# Patient Record
Sex: Female | Born: 1985 | Race: Black or African American | Hispanic: No | Marital: Single | Smoking: Never smoker
Health system: Southern US, Community
[De-identification: ages and names within clinical notes are randomized; demographics above are authoritative.]

## PROBLEM LIST (undated history)

## (undated) DIAGNOSIS — D219 Benign neoplasm of connective and other soft tissue, unspecified: Secondary | ICD-10-CM

## (undated) DIAGNOSIS — M779 Enthesopathy, unspecified: Secondary | ICD-10-CM

## (undated) DIAGNOSIS — A749 Chlamydial infection, unspecified: Secondary | ICD-10-CM

## (undated) DIAGNOSIS — I10 Essential (primary) hypertension: Secondary | ICD-10-CM

## (undated) DIAGNOSIS — Z872 Personal history of diseases of the skin and subcutaneous tissue: Secondary | ICD-10-CM

## (undated) DIAGNOSIS — B009 Herpesviral infection, unspecified: Secondary | ICD-10-CM

## (undated) DIAGNOSIS — R000 Tachycardia, unspecified: Secondary | ICD-10-CM

## (undated) DIAGNOSIS — E6609 Other obesity due to excess calories: Secondary | ICD-10-CM

## (undated) DIAGNOSIS — N946 Dysmenorrhea, unspecified: Secondary | ICD-10-CM

## (undated) DIAGNOSIS — M6289 Other specified disorders of muscle: Secondary | ICD-10-CM

## (undated) DIAGNOSIS — E669 Obesity, unspecified: Secondary | ICD-10-CM

## (undated) DIAGNOSIS — D259 Leiomyoma of uterus, unspecified: Secondary | ICD-10-CM

## (undated) DIAGNOSIS — E785 Hyperlipidemia, unspecified: Secondary | ICD-10-CM

## (undated) DIAGNOSIS — E119 Type 2 diabetes mellitus without complications: Secondary | ICD-10-CM

## (undated) DIAGNOSIS — R0602 Shortness of breath: Secondary | ICD-10-CM

## (undated) DIAGNOSIS — N9489 Other specified conditions associated with female genital organs and menstrual cycle: Secondary | ICD-10-CM

## (undated) DIAGNOSIS — J90 Pleural effusion, not elsewhere classified: Secondary | ICD-10-CM

## (undated) DIAGNOSIS — A6004 Herpesviral vulvovaginitis: Secondary | ICD-10-CM

## (undated) DIAGNOSIS — R002 Palpitations: Secondary | ICD-10-CM

## (undated) DIAGNOSIS — L83 Acanthosis nigricans: Secondary | ICD-10-CM

## (undated) HISTORY — DX: Pleural effusion, not elsewhere classified: J90

## (undated) HISTORY — DX: Essential (primary) hypertension: I10

## (undated) HISTORY — DX: Dysmenorrhea, unspecified: N94.6

## (undated) HISTORY — DX: Herpesviral vulvovaginitis: A60.04

## (undated) HISTORY — DX: Enthesopathy, unspecified: M77.9

## (undated) HISTORY — DX: Chlamydial infection, unspecified: A74.9

## (undated) HISTORY — DX: Other specified conditions associated with female genital organs and menstrual cycle: N94.89

## (undated) HISTORY — DX: Palpitations: R00.2

## (undated) HISTORY — DX: Other obesity due to excess calories: E66.09

## (undated) HISTORY — DX: Hyperlipidemia, unspecified: E78.5

## (undated) HISTORY — DX: Leiomyoma of uterus, unspecified: D25.9

## (undated) HISTORY — DX: Obesity, unspecified: E66.9

## (undated) HISTORY — DX: Tachycardia, unspecified: R00.0

## (undated) HISTORY — DX: Type 2 diabetes mellitus without complications: E11.9

## (undated) HISTORY — DX: Acanthosis nigricans: L83

## (undated) HISTORY — DX: Herpesviral infection, unspecified: B00.9

## (undated) HISTORY — DX: Other specified disorders of muscle: M62.89

## (undated) HISTORY — PX: NO PAST SURGERIES: SHX2092

## (undated) HISTORY — DX: Benign neoplasm of connective and other soft tissue, unspecified: D21.9

---
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
## 2005-08-13 ENCOUNTER — Emergency Department (HOSPITAL_COMMUNITY): Admission: EM | Admit: 2005-08-13 | Discharge: 2005-08-13 | Payer: Self-pay | Admitting: Emergency Medicine

## 2006-03-31 ENCOUNTER — Other Ambulatory Visit: Admission: RE | Admit: 2006-03-31 | Discharge: 2006-03-31 | Payer: Self-pay | Admitting: Family Medicine

## 2006-05-26 DIAGNOSIS — E7800 Pure hypercholesterolemia, unspecified: Secondary | ICD-10-CM | POA: Insufficient documentation

## 2006-05-27 ENCOUNTER — Encounter (INDEPENDENT_AMBULATORY_CARE_PROVIDER_SITE_OTHER): Payer: Self-pay | Admitting: *Deleted

## 2008-09-04 ENCOUNTER — Other Ambulatory Visit: Admission: RE | Admit: 2008-09-04 | Discharge: 2008-09-04 | Payer: Self-pay | Admitting: Family Medicine

## 2009-10-06 ENCOUNTER — Emergency Department (HOSPITAL_COMMUNITY): Admission: EM | Admit: 2009-10-06 | Discharge: 2009-10-06 | Payer: Self-pay | Admitting: Emergency Medicine

## 2010-11-18 ENCOUNTER — Other Ambulatory Visit: Payer: Self-pay | Admitting: Obstetrics and Gynecology

## 2010-11-18 ENCOUNTER — Other Ambulatory Visit (HOSPITAL_COMMUNITY)
Admission: RE | Admit: 2010-11-18 | Discharge: 2010-11-18 | Disposition: A | Discharge status: Home / Self Care | Payer: Managed Care, Other (non HMO) | Source: Ambulatory Visit | Attending: Obstetrics and Gynecology | Admitting: Obstetrics and Gynecology

## 2010-11-18 DIAGNOSIS — N760 Acute vaginitis: Secondary | ICD-10-CM | POA: Insufficient documentation

## 2010-11-18 DIAGNOSIS — Z113 Encounter for screening for infections with a predominantly sexual mode of transmission: Secondary | ICD-10-CM | POA: Insufficient documentation

## 2010-11-18 DIAGNOSIS — Z01419 Encounter for gynecological examination (general) (routine) without abnormal findings: Secondary | ICD-10-CM | POA: Insufficient documentation

## 2012-06-19 ENCOUNTER — Other Ambulatory Visit: Payer: Self-pay | Admitting: Family Medicine

## 2012-06-19 ENCOUNTER — Other Ambulatory Visit (HOSPITAL_COMMUNITY)
Admission: RE | Admit: 2012-06-19 | Discharge: 2012-06-19 | Disposition: A | Discharge status: Home / Self Care | Payer: 59 | Source: Ambulatory Visit | Attending: Family Medicine | Admitting: Family Medicine

## 2012-06-19 DIAGNOSIS — Z0000 Encounter for general adult medical examination without abnormal findings: Secondary | ICD-10-CM | POA: Insufficient documentation

## 2013-05-09 ENCOUNTER — Other Ambulatory Visit: Payer: Self-pay | Admitting: Obstetrics and Gynecology

## 2013-05-09 ENCOUNTER — Other Ambulatory Visit (HOSPITAL_COMMUNITY)
Admission: RE | Admit: 2013-05-09 | Discharge: 2013-05-09 | Disposition: A | Discharge status: Home / Self Care | Payer: 59 | Source: Ambulatory Visit | Attending: Obstetrics and Gynecology | Admitting: Obstetrics and Gynecology

## 2013-05-09 DIAGNOSIS — Z1151 Encounter for screening for human papillomavirus (HPV): Secondary | ICD-10-CM | POA: Insufficient documentation

## 2013-05-09 DIAGNOSIS — Z01419 Encounter for gynecological examination (general) (routine) without abnormal findings: Secondary | ICD-10-CM | POA: Insufficient documentation

## 2013-05-14 ENCOUNTER — Other Ambulatory Visit: Payer: Self-pay | Admitting: Nurse Practitioner

## 2013-05-14 ENCOUNTER — Other Ambulatory Visit: Payer: Self-pay | Admitting: Physician Assistant

## 2013-05-14 DIAGNOSIS — N6019 Diffuse cystic mastopathy of unspecified breast: Secondary | ICD-10-CM

## 2013-05-18 ENCOUNTER — Ambulatory Visit
Admission: RE | Admit: 2013-05-18 | Discharge: 2013-05-18 | Disposition: A | Discharge status: Home / Self Care | Payer: 59 | Source: Ambulatory Visit | Attending: Nurse Practitioner | Admitting: Nurse Practitioner

## 2013-05-18 DIAGNOSIS — N6019 Diffuse cystic mastopathy of unspecified breast: Secondary | ICD-10-CM

## 2013-10-12 ENCOUNTER — Emergency Department (HOSPITAL_COMMUNITY)
Admission: EM | Admit: 2013-10-12 | Discharge: 2013-10-12 | Disposition: A | Discharge status: Home / Self Care | Payer: 59 | Source: Home / Self Care | Attending: Family Medicine | Admitting: Family Medicine

## 2013-10-12 ENCOUNTER — Encounter (HOSPITAL_COMMUNITY): Payer: Self-pay | Admitting: Emergency Medicine

## 2013-10-12 DIAGNOSIS — R42 Dizziness and giddiness: Secondary | ICD-10-CM

## 2013-10-12 DIAGNOSIS — G44209 Tension-type headache, unspecified, not intractable: Secondary | ICD-10-CM

## 2013-10-12 LAB — POCT I-STAT, CHEM 8
BUN: 8 mg/dL (ref 6–23)
Calcium, Ion: 1.18 mmol/L (ref 1.12–1.23)
Chloride: 103 meq/L (ref 96–112)
Creatinine, Ser: 1 mg/dL (ref 0.50–1.10)
Glucose, Bld: 91 mg/dL (ref 70–99)
HCT: 46 % (ref 36.0–46.0)
Hemoglobin: 15.6 g/dL — ABNORMAL HIGH (ref 12.0–15.0)
Potassium: 4.5 meq/L (ref 3.7–5.3)
Sodium: 138 meq/L (ref 137–147)
TCO2: 24 mmol/L (ref 0–100)

## 2013-10-12 LAB — POCT URINALYSIS DIP (DEVICE)
BILIRUBIN URINE: NEGATIVE
GLUCOSE, UA: NEGATIVE mg/dL
KETONES UR: NEGATIVE mg/dL
Leukocytes, UA: NEGATIVE
NITRITE: NEGATIVE
PH: 7 (ref 5.0–8.0)
Protein, ur: NEGATIVE mg/dL
Specific Gravity, Urine: 1.02 (ref 1.005–1.030)
Urobilinogen, UA: 0.2 mg/dL (ref 0.0–1.0)

## 2013-10-12 LAB — POCT PREGNANCY, URINE: PREG TEST UR: NEGATIVE

## 2013-10-12 MED ORDER — MECLIZINE HCL 25 MG PO TABS
25.0000 mg | ORAL_TABLET | Freq: Four times a day (QID) | ORAL | Status: DC
Start: 2013-10-12 — End: 2013-12-10

## 2013-10-12 NOTE — ED Provider Notes (Signed)
CSN: [id number]     Arrival date & time 10/12/13  6283 History   First MD Initiated Contact with Patient 10/12/13 [phone]     Chief Complaint  Patient presents with  . Headache   (Consider location/radiation/quality/duration/timing/severity/associated sxs/prior Treatment) HPI Comments: 28 year old female presents complaining of dizziness. Yesterday she started to have dizziness that is worse with going from a seated to a standing position. She describes this as feeling like the room is spinning around her. If she sits still it goes away somewhat and she starts to feel better. She has had nausea but no actual vomiting. She has an associated headache that is described as a mild headache behind her eyes and in the left side of her forehead, she is not having headache right now but has had occasionally for the past few years. She also admits to having a slightly heavier period than usual that started 2 days ago. She also has a history of uterine fibroids. No significant history of dizziness or vertigo. All other review of systems negative  Patient is a 28 y.o. female presenting with headaches.  Headache Associated symptoms: dizziness and nausea   Associated symptoms: no abdominal pain, no cough, no fever, no myalgias and no vomiting     History reviewed. No pertinent past medical history. History reviewed. No pertinent past surgical history. History reviewed. No pertinent family history. History  Substance Use Topics  . Smoking status: Not on file  . Smokeless tobacco: Not on file  . Alcohol Use: No   OB History   Grav Para Term Preterm Abortions TAB SAB Ect Mult Living                 Review of Systems  Constitutional: Negative for fever and chills.  Eyes: Negative for visual disturbance.  Respiratory: Negative for cough and shortness of breath.   Cardiovascular: Negative for chest pain, palpitations and leg swelling.  Gastrointestinal: Positive for nausea. Negative for vomiting and  abdominal pain.  Endocrine: Negative for polydipsia and polyuria.  Genitourinary: Positive for vaginal bleeding. Negative for dysuria, urgency and frequency.  Musculoskeletal: Negative for arthralgias and myalgias.  Skin: Negative for rash.  Neurological: Positive for dizziness and headaches. Negative for weakness and light-headedness.  All other systems reviewed and are negative.   Allergies  Review of patient's allergies indicates no known allergies.  Home Medications   Prior to Admission medications   Medication Sig Start Date End Date Taking? Authorizing Provider  meclizine (ANTIVERT) 25 MG tablet Take 1 tablet (25 mg total) by mouth 4 (four) times daily. 10/12/13   Freeman Caldron Kaari Zeigler, PA-C   BP 127/79  Pulse 81  Temp(Src) 98.8 F (37.1 C) (Oral)  Resp 16  SpO2 100% Physical Exam  Nursing note and vitals reviewed. Constitutional: She is oriented to person, place, and time. Vital signs are normal. She appears well-developed and well-nourished. No distress.  HENT:  Head: Normocephalic and atraumatic.  Right Ear: Tympanic membrane, external ear and ear canal normal.  Left Ear: Tympanic membrane, external ear and ear canal normal.  Cardiovascular: Normal rate, regular rhythm and normal heart sounds.  Exam reveals no gallop and no friction rub.   No murmur heard. Pulmonary/Chest: Effort normal and breath sounds normal. No respiratory distress. She has no wheezes. She has no rales.  Neurological: She is alert and oriented to person, place, and time. She has normal strength and normal reflexes. No cranial nerve deficit or sensory deficit. She exhibits normal muscle tone. She displays  a negative Romberg sign. Coordination and gait normal.  Skin: Skin is warm and dry. No rash noted. She is not diaphoretic.  Psychiatric: She has a normal mood and affect. Judgment normal.    ED Course  ED EKG  Date/Time: 10/12/2013 10:01 AM Performed by: Allena Katz, H Authorized by: Allena Katz, H Comparison: not compared with previous ECG  Rhythm: sinus rhythm Rate: normal QRS axis: normal Conduction: conduction normal ST Segments: ST segments normal T Waves: T waves normal Other: no other findings Clinical impression: normal ECG   (including critical care time) Labs Review Labs Reviewed  POCT URINALYSIS DIP (DEVICE) - Abnormal; Notable for the following:    Hgb urine dipstick TRACE (*)    All other components within normal limits  POCT I-STAT, CHEM 8 - Abnormal; Notable for the following:    Hemoglobin 15.6 (*)    All other components within normal limits  POCT PREGNANCY, URINE    Imaging Review No results found.   MDM   1. Vertigo   2. Tension headache    most consistent with positional vertigo. Treat with meclizine. Advised Excedrin Migraine for the headaches. Followup PRN if no improvement in a few days    Meds ordered this encounter  Medications  . meclizine (ANTIVERT) 25 MG tablet    Sig: Take 1 tablet (25 mg total) by mouth 4 (four) times daily.    Dispense:  28 tablet    Refill:  0    Order Specific Question:  Supervising Provider    Answer:  Lynne Leader, [place]       Liam Graham, PA-C 10/12/13 1007

## 2013-10-12 NOTE — Discharge Instructions (Signed)
Vertigo Vertigo means you feel like you or your surroundings are moving when they are not. Vertigo can be dangerous if it occurs when you are at work, driving, or performing difficult activities.  CAUSES  Vertigo occurs when there is a conflict of signals sent to your brain from the visual and sensory systems in your body. There are many different causes of vertigo, including:  Infections, especially in the inner ear.  A bad reaction to a drug or misuse of alcohol and medicines.  Withdrawal from drugs or alcohol.  Rapidly changing positions, such as lying down or rolling over in bed.  A migraine headache.  Decreased blood flow to the brain.  Increased pressure in the brain from a head injury, infection, tumor, or bleeding. SYMPTOMS  You may feel as though the world is spinning around or you are falling to the ground. Because your balance is upset, vertigo can cause nausea and vomiting. You may have involuntary eye movements (nystagmus). DIAGNOSIS  Vertigo is usually diagnosed by physical exam. If the cause of your vertigo is unknown, your caregiver may perform imaging tests, such as an MRI scan (magnetic resonance imaging). TREATMENT  Most cases of vertigo resolve on their own, without treatment. Depending on the cause, your caregiver may prescribe certain medicines. If your vertigo is related to body position issues, your caregiver may recommend movements or procedures to correct the problem. In rare cases, if your vertigo is caused by certain inner ear problems, you may need surgery. HOME CARE INSTRUCTIONS   Follow your caregiver's instructions.  Avoid driving.  Avoid operating heavy machinery.  Avoid performing any tasks that would be dangerous to you or others during a vertigo episode.  Tell your caregiver if you notice that certain medicines seem to be causing your vertigo. Some of the medicines used to treat vertigo episodes can actually make them worse in some people. SEEK  IMMEDIATE MEDICAL CARE IF:   Your medicines do not relieve your vertigo or are making it worse.  You develop problems with talking, walking, weakness, or using your arms, hands, or legs.  You develop severe headaches.  Your nausea or vomiting continues or gets worse.  You develop visual changes.  A family member notices behavioral changes.  Your condition gets worse. MAKE SURE YOU:  Understand these instructions.  Will watch your condition.  Will get help right away if you are not doing well or get worse. Document Released: 12/23/2004 Document Revised: 06/07/2011 Document Reviewed: 10/01/2010 [facility] Patient Information 2015 [place], [place]. This information is not intended to replace advice given to you by your health care provider. Make sure you discuss any questions you have with your health care provider.   Dizziness Dizziness is a common problem. It is a feeling of unsteadiness or light-headedness. You may feel like you are about to faint. Dizziness can lead to injury if you stumble or fall. A person of any age group can suffer from dizziness, but dizziness is more common in older adults. CAUSES  Dizziness can be caused by many different things, including:  Middle ear problems.  Standing for too long.  Infections.  An allergic reaction.  Aging.  An emotional response to something, such as the sight of blood.  Side effects of medicines.  Tiredness.  Problems with circulation or blood pressure.  Excessive use of alcohol or medicines, or illegal drug use.  Breathing too fast (hyperventilation).  An irregular heart rhythm (arrhythmia).  A low red blood cell count (anemia).  Pregnancy.  Vomiting, diarrhea, fever, or other illnesses that cause body fluid loss (dehydration).  Diseases or conditions such as Parkinson's disease, high blood pressure (hypertension), diabetes, and thyroid problems.  Exposure to extreme heat. DIAGNOSIS  Your health care  provider will ask about your symptoms, perform a physical exam, and perform an electrocardiogram (ECG) to record the electrical activity of your heart. Your health care provider may also perform other heart or blood tests to determine the cause of your dizziness. These may include:  Transthoracic echocardiogram (TTE). During echocardiography, sound waves are used to evaluate how blood flows through your heart.  Transesophageal echocardiogram (TEE).  Cardiac monitoring. This allows your health care provider to monitor your heart rate and rhythm in real time.  Holter monitor. This is a portable device that records your heartbeat and can help diagnose heart arrhythmias. It allows your health care provider to track your heart activity for several days if needed.  Stress tests by exercise or by giving medicine that makes the heart beat faster. TREATMENT  Treatment of dizziness depends on the cause of your symptoms and can vary greatly. HOME CARE INSTRUCTIONS   Drink enough fluids to keep your urine clear or pale yellow. This is especially important in very hot weather. In older adults, it is also important in cold weather.  Take your medicine exactly as directed if your dizziness is caused by medicines. When taking blood pressure medicines, it is especially important to get up slowly.  Rise slowly from chairs and steady yourself until you feel okay.  In the morning, first sit up on the side of the bed. When you feel okay, stand slowly while holding onto something until you know your balance is fine.  Move your legs often if you need to stand in one place for a long time. Tighten and relax your muscles in your legs while standing.  Have someone stay with you for 1-2 days if dizziness continues to be a problem. Do this until you feel you are well enough to stay alone. Have the person call your health care provider if he or she notices changes in you that are concerning.  Do not drive or use heavy  machinery if you feel dizzy.  Do not drink alcohol. SEEK IMMEDIATE MEDICAL CARE IF:   Your dizziness or light-headedness gets worse.  You feel nauseous or vomit.  You have problems talking, walking, or using your arms, hands, or legs.  You feel weak.  You are not thinking clearly or you have trouble forming sentences. It may take a friend or family member to notice this.  You have chest pain, abdominal pain, shortness of breath, or sweating.  Your vision changes.  You notice any bleeding.  You have side effects from medicine that seems to be getting worse rather than better. MAKE SURE YOU:   Understand these instructions.  Will watch your condition.  Will get help right away if you are not doing well or get worse. Document Released: 09/08/2000 Document Revised: 03/20/2013 Document Reviewed: 10/02/2010 [facility] Patient Information 2015 [place], [place]. This information is not intended to replace advice given to you by your health care provider. Make sure you discuss any questions you have with your health care provider.  Epley Maneuver Self-Care WHAT IS THE EPLEY MANEUVER? The Epley maneuver is an exercise you can do to relieve symptoms of benign paroxysmal positional vertigo (BPPV). This condition is often just referred to as vertigo. BPPV is caused by the movement of tiny crystals (canaliths) inside  your inner ear. The accumulation and movement of canaliths in your inner ear causes a sudden spinning sensation (vertigo) when you move your head to certain positions. Vertigo usually lasts about 30 seconds. BPPV usually occurs in just one ear. If you get vertigo when you lie on your left side, you probably have BPPV in your left ear. Your health care provider can tell you which ear is involved.  BPPV may be caused by a head injury. Many people older than 50 get BPPV for unknown reasons. If you have been diagnosed with BPPV, your health care provider may teach you how to do this  maneuver. BPPV is not life threatening (benign) and usually goes away in time.  WHEN SHOULD I PERFORM THE EPLEY MANEUVER? You can do this maneuver at home whenever you have symptoms of vertigo. You may do the Epley maneuver up to 3 times a day until your symptoms of vertigo go away. HOW SHOULD I DO THE EPLEY MANEUVER? 1. Sit on the edge of a bed or table with your back straight. Your legs should be extended or hanging over the edge of the bed or table.  2. Turn your head halfway toward the affected ear.  3. Lie backward quickly with your head turned until you are lying flat on your back. You may want to position a pillow under your shoulders.  4. Hold this position for 30 seconds. You may experience an attack of vertigo. This is normal. Hold this position until the vertigo stops. 5. Then turn your head to the opposite direction until your unaffected ear is facing the floor.  6. Hold this position for 30 seconds. You may experience an attack of vertigo. This is normal. Hold this position until the vertigo stops. 7. Now turn your whole body to the same side as your head. Hold for another 30 seconds.  8. You can then sit back up. ARE THERE RISKS TO THIS MANEUVER? In some cases, you may have other symptoms (such as changes in your vision, weakness, or numbness). If you have these symptoms, stop doing the maneuver and call your health care provider. Even if doing these maneuvers relieves your vertigo, you may still have dizziness. Dizziness is the sensation of light-headedness but without the sensation of movement. Even though the Epley maneuver may relieve your vertigo, it is possible that your symptoms will return within 5 years. WHAT SHOULD I DO AFTER THIS MANEUVER? After doing the Epley maneuver, you can return to your normal activities. Ask your doctor if there is anything you should do at home to prevent vertigo. This may include:  Sleeping with two or more pillows to keep your head  elevated.  Not sleeping on the side of your affected ear.  Getting up slowly from bed.  Avoiding sudden movements during the day.  Avoiding extreme head movement, like looking up or bending over.  Wearing a cervical collar to prevent sudden head movements. WHAT SHOULD I DO IF MY SYMPTOMS GET WORSE? Call your health care provider if your vertigo gets worse. Call your provider right way if you have other symptoms, including:   Nausea.  Vomiting.  Headache.  Weakness.  Numbness.  Vision changes. Document Released: 03/20/2013 Document Reviewed: 02/06/2013 [facility] Patient Information 2015 [place], [place]. This information is not intended to replace advice given to you by your health care provider. Make sure you discuss any questions you have with your health care provider.  Tension Headache A tension headache is a feeling of pain,  pressure, or aching often felt over the front and sides of the head. The pain can be dull or can feel tight (constricting). It is the most common type of headache. Tension headaches are not normally associated with nausea or vomiting and do not get worse with physical activity. Tension headaches can last 30 minutes to several days.  CAUSES  The exact cause is not known, but it may be caused by chemicals and hormones in the brain that lead to pain. Tension headaches often begin after stress, anxiety, or depression. Other triggers may include:  Alcohol.  Caffeine (too much or withdrawal).  Respiratory infections (colds, flu, sinus infections).  Dental problems or teeth clenching.  Fatigue.  Holding your head and neck in one position too long while using a computer. SYMPTOMS   Pressure around the head.   Dull, aching head pain.   Pain felt over the front and sides of the head.   Tenderness in the muscles of the head, neck, and shoulders. DIAGNOSIS  A tension headache is often diagnosed based on:   Symptoms.   Physical examination.    A CT scan or MRI of your head. These tests may be ordered if symptoms are severe or unusual. TREATMENT  Medicines may be given to help relieve symptoms.  HOME CARE INSTRUCTIONS   Only take over-the-counter or prescription medicines for pain or discomfort as directed by your caregiver.   Lie down in a dark, quiet room when you have a headache.   Keep a journal to find out what may be triggering your headaches. For example, write down:  What you eat and drink.  How much sleep you get.  Any change to your diet or medicines.  Try massage or other relaxation techniques.   Ice packs or heat applied to the head and neck can be used. Use these 3 to 4 times per day for 15 to 20 minutes each time, or as needed.   Limit stress.   Sit up straight, and do not tense your muscles.   Quit smoking if you smoke.  Limit alcohol use.  Decrease the amount of caffeine you drink, or stop drinking caffeine.  Eat and exercise regularly.  Get 7 to 9 hours of sleep, or as recommended by your caregiver.  Avoid excessive use of pain medicine as recurrent headaches can occur.  SEEK MEDICAL CARE IF:   You have problems with the medicines you were prescribed.  Your medicines do not work.  You have a change from the usual headache.  You have nausea or vomiting. SEEK IMMEDIATE MEDICAL CARE IF:   Your headache becomes severe.  You have a fever.  You have a stiff neck.  You have loss of vision.  You have muscular weakness or loss of muscle control.  You lose your balance or have trouble walking.  You feel faint or pass out.  You have severe symptoms that are different from your first symptoms. MAKE SURE YOU:   Understand these instructions.  Will watch your condition.  Will get help right away if you are not doing well or get worse. Document Released: 03/15/2005 Document Revised: 06/07/2011 Document Reviewed: 03/05/2011 [facility] Patient Information 2015 [place], [place].  This information is not intended to replace advice given to you by your health care provider. Make sure you discuss any questions you have with your health care provider.

## 2013-10-12 NOTE — ED Notes (Signed)
Pt  Reports  Headache  /  Dizzy         And   Associated  Nausea     Recently   -  denys  Any injury    Awake  And  Alert  And  Oriented      Pt  Also  Reports  Vag  Bleeding           Heavy  For cycle       As  Well  As  menstural  Cramps

## 2013-10-14 NOTE — ED Provider Notes (Signed)
Medical screening examination/treatment/procedure(s) were performed by a resident physician or non-physician practitioner and as the supervising physician I was immediately available for consultation/collaboration.  Lynne Leader, MD    Gregor Hams, MD 10/14/13 [phone]

## 2013-12-10 ENCOUNTER — Encounter (HOSPITAL_COMMUNITY): Payer: Self-pay | Admitting: Pharmacist

## 2013-12-19 ENCOUNTER — Encounter (HOSPITAL_COMMUNITY): Payer: Self-pay

## 2013-12-19 ENCOUNTER — Encounter (HOSPITAL_COMMUNITY)
Admission: RE | Admit: 2013-12-19 | Discharge: 2013-12-19 | Disposition: A | Discharge status: Home / Self Care | Payer: 59 | Source: Ambulatory Visit | Attending: Obstetrics and Gynecology | Admitting: Obstetrics and Gynecology

## 2013-12-19 DIAGNOSIS — Z01812 Encounter for preprocedural laboratory examination: Secondary | ICD-10-CM | POA: Insufficient documentation

## 2013-12-19 DIAGNOSIS — D259 Leiomyoma of uterus, unspecified: Secondary | ICD-10-CM | POA: Insufficient documentation

## 2013-12-19 HISTORY — DX: Shortness of breath: R06.02

## 2013-12-19 LAB — CBC
HEMATOCRIT: 39.7 % (ref 36.0–46.0)
HEMOGLOBIN: 13.3 g/dL (ref 12.0–15.0)
MCH: 26.8 pg (ref 26.0–34.0)
MCHC: 33.5 g/dL (ref 30.0–36.0)
MCV: 80 fL (ref 78.0–100.0)
Platelets: 315 10*3/uL (ref 150–400)
RBC: 4.96 MIL/uL (ref 3.87–5.11)
RDW: 15.2 % (ref 11.5–15.5)
WBC: 9.9 10*3/uL (ref 4.0–10.5)

## 2013-12-19 LAB — ABO/RH: ABO/RH(D): O POS

## 2013-12-19 NOTE — Patient Instructions (Signed)
Your procedure is scheduled on: 12/24/13  Enter through the Main Entrance at : [place] up desk phone and dial [phone] and inform us of your arrival.  Please call [phone] if you have any problems the morning of surgery.  Remember: Do not eat food or drink liquids, including water, after midnight: [place]  You may brush your teeth the morning of surgery  DO NOT wear jewelry, eye make-up, lipstick,body lotion, or dark fingernail polish.  (Polished toes are ok) You may wear deodorant.  If you are to be admitted after surgery, leave suitcase in car until your room has been assigned. Patients discharged on the day of surgery will not be allowed to drive home. Wear loose fitting, comfortable clothes for your ride home.

## 2013-12-24 ENCOUNTER — Inpatient Hospital Stay (HOSPITAL_COMMUNITY): Payer: 59 | Admitting: Anesthesiology

## 2013-12-24 ENCOUNTER — Encounter (HOSPITAL_COMMUNITY)
Admission: RE | Disposition: A | Discharge status: Home / Self Care | Payer: Self-pay | Source: Ambulatory Visit | Attending: Obstetrics and Gynecology

## 2013-12-24 ENCOUNTER — Encounter (HOSPITAL_COMMUNITY): Payer: Self-pay

## 2013-12-24 ENCOUNTER — Encounter (HOSPITAL_COMMUNITY): Payer: 59 | Admitting: Anesthesiology

## 2013-12-24 ENCOUNTER — Inpatient Hospital Stay (HOSPITAL_COMMUNITY)
Admission: RE | Admit: 2013-12-24 | Discharge: 2013-12-26 | DRG: 742 | Disposition: A | Discharge status: Home / Self Care | Payer: 59 | Source: Ambulatory Visit | Attending: Obstetrics and Gynecology | Admitting: Obstetrics and Gynecology

## 2013-12-24 DIAGNOSIS — N925 Other specified irregular menstruation: Secondary | ICD-10-CM | POA: Diagnosis present

## 2013-12-24 DIAGNOSIS — Z823 Family history of stroke: Secondary | ICD-10-CM

## 2013-12-24 DIAGNOSIS — N949 Unspecified condition associated with female genital organs and menstrual cycle: Secondary | ICD-10-CM | POA: Diagnosis present

## 2013-12-24 DIAGNOSIS — D219 Benign neoplasm of connective and other soft tissue, unspecified: Secondary | ICD-10-CM | POA: Diagnosis present

## 2013-12-24 DIAGNOSIS — Z6841 Body Mass Index (BMI) 40.0 and over, adult: Secondary | ICD-10-CM

## 2013-12-24 DIAGNOSIS — D259 Leiomyoma of uterus, unspecified: Secondary | ICD-10-CM | POA: Diagnosis present

## 2013-12-24 DIAGNOSIS — Z833 Family history of diabetes mellitus: Secondary | ICD-10-CM

## 2013-12-24 DIAGNOSIS — N938 Other specified abnormal uterine and vaginal bleeding: Secondary | ICD-10-CM | POA: Diagnosis present

## 2013-12-24 DIAGNOSIS — D500 Iron deficiency anemia secondary to blood loss (chronic): Secondary | ICD-10-CM | POA: Diagnosis present

## 2013-12-24 DIAGNOSIS — Z9889 Other specified postprocedural states: Secondary | ICD-10-CM

## 2013-12-24 HISTORY — PX: MYOMECTOMY: SHX85

## 2013-12-24 LAB — CBC
HCT: 31 % — ABNORMAL LOW (ref 36.0–46.0)
Hemoglobin: 10.4 g/dL — ABNORMAL LOW (ref 12.0–15.0)
MCH: 26.9 pg (ref 26.0–34.0)
MCHC: 33.5 g/dL (ref 30.0–36.0)
MCV: 80.1 fL (ref 78.0–100.0)
PLATELETS: 234 10*3/uL (ref 150–400)
RBC: 3.87 MIL/uL (ref 3.87–5.11)
RDW: 15.1 % (ref 11.5–15.5)
WBC: 19.7 10*3/uL — ABNORMAL HIGH (ref 4.0–10.5)

## 2013-12-24 LAB — PREGNANCY, URINE: Preg Test, Ur: NEGATIVE

## 2013-12-24 LAB — PREPARE RBC (CROSSMATCH)

## 2013-12-24 SURGERY — MYOMECTOMY, ABDOMINAL APPROACH
Anesthesia: General | Site: Abdomen

## 2013-12-24 MED ORDER — ONDANSETRON HCL 4 MG/2ML IJ SOLN
INTRAMUSCULAR | Status: AC
  Filled 2013-12-24: qty 2

## 2013-12-24 MED ORDER — NEOSTIGMINE METHYLSULFATE 10 MG/10ML IV SOLN
INTRAVENOUS | Status: AC
  Filled 2013-12-24: qty 1

## 2013-12-24 MED ORDER — KETOROLAC TROMETHAMINE 30 MG/ML IJ SOLN
30.0000 mg | Freq: Four times a day (QID) | INTRAMUSCULAR | Status: DC | PRN
  Administered 2013-12-24 – 2013-12-25 (×2): 30 mg via INTRAVENOUS
  Filled 2013-12-24 (×2): qty 1

## 2013-12-24 MED ORDER — HYDROMORPHONE 0.3 MG/ML IV SOLN
INTRAVENOUS | Status: DC
  Administered 2013-12-24: 1.2 mg via INTRAVENOUS
  Administered 2013-12-24: 15:00:00 via INTRAVENOUS
  Administered 2013-12-24: 2 mg via INTRAVENOUS
  Administered 2013-12-25: 0.6 mg via INTRAVENOUS
  Administered 2013-12-25: 0.3 mg via INTRAVENOUS
  Filled 2013-12-24: qty 25

## 2013-12-24 MED ORDER — ONDANSETRON HCL 4 MG/2ML IJ SOLN
INTRAMUSCULAR | Status: DC | PRN
  Administered 2013-12-24: 4 mg via INTRAVENOUS

## 2013-12-24 MED ORDER — GLYCOPYRROLATE 0.2 MG/ML IJ SOLN
INTRAMUSCULAR | Status: AC
Start: 2013-12-24 — End: 2013-12-24
  Filled 2013-12-24: qty 3

## 2013-12-24 MED ORDER — MIDAZOLAM HCL 2 MG/2ML IJ SOLN
INTRAMUSCULAR | Status: DC | PRN
  Administered 2013-12-24: 2 mg via INTRAVENOUS

## 2013-12-24 MED ORDER — MEPERIDINE HCL 25 MG/ML IJ SOLN: 6.2500 mg | INTRAMUSCULAR | Status: DC | PRN

## 2013-12-24 MED ORDER — IBUPROFEN 600 MG PO TABS
600.0000 mg | ORAL_TABLET | Freq: Four times a day (QID) | ORAL | Status: DC | PRN
  Administered 2013-12-25 – 2013-12-26 (×4): 600 mg via ORAL
  Filled 2013-12-24 (×4): qty 1

## 2013-12-24 MED ORDER — PROPOFOL 10 MG/ML IV EMUL
INTRAVENOUS | Status: AC
  Filled 2013-12-24: qty 20

## 2013-12-24 MED ORDER — DEXAMETHASONE SODIUM PHOSPHATE 4 MG/ML IJ SOLN
INTRAMUSCULAR | Status: DC | PRN
  Administered 2013-12-24: 4 mg via INTRAVENOUS

## 2013-12-24 MED ORDER — KETOROLAC TROMETHAMINE 30 MG/ML IJ SOLN
INTRAMUSCULAR | Status: DC | PRN
  Administered 2013-12-24: 30 mg via INTRAVENOUS

## 2013-12-24 MED ORDER — PROPOFOL 10 MG/ML IV BOLUS
INTRAVENOUS | Status: DC | PRN
  Administered 2013-12-24: 200 mg via INTRAVENOUS

## 2013-12-24 MED ORDER — DIPHENHYDRAMINE HCL 50 MG/ML IJ SOLN
12.5000 mg | Freq: Four times a day (QID) | INTRAMUSCULAR | Status: DC | PRN
Start: 2013-12-24 — End: 2013-12-25

## 2013-12-24 MED ORDER — PROPOFOL 10 MG/ML IV EMUL
INTRAVENOUS | Status: AC
  Filled 2013-12-24: qty 40

## 2013-12-24 MED ORDER — DEXAMETHASONE SODIUM PHOSPHATE 10 MG/ML IJ SOLN
INTRAMUSCULAR | Status: AC
  Filled 2013-12-24: qty 1

## 2013-12-24 MED ORDER — GLYCOPYRROLATE 0.2 MG/ML IJ SOLN
INTRAMUSCULAR | Status: DC | PRN
  Administered 2013-12-24: 0.4 mg via INTRAVENOUS

## 2013-12-24 MED ORDER — ACETAMINOPHEN 160 MG/5ML PO SOLN
975.0000 mg | Freq: Once | ORAL | Status: AC
  Administered 2013-12-24: 975 mg via ORAL

## 2013-12-24 MED ORDER — SCOPOLAMINE 1 MG/3DAYS TD PT72
1.0000 | MEDICATED_PATCH | Freq: Once | TRANSDERMAL | Status: DC
  Administered 2013-12-24: 1.5 mg via TRANSDERMAL

## 2013-12-24 MED ORDER — CEFAZOLIN SODIUM-DEXTROSE 2-3 GM-% IV SOLR
2.0000 g | INTRAVENOUS | Status: AC
  Administered 2013-12-24: 2 g via INTRAVENOUS

## 2013-12-24 MED ORDER — NALOXONE HCL 0.4 MG/ML IJ SOLN
0.4000 mg | INTRAMUSCULAR | Status: DC | PRN
Start: 2013-12-24 — End: 2013-12-25

## 2013-12-24 MED ORDER — KETOROLAC TROMETHAMINE 30 MG/ML IJ SOLN
INTRAMUSCULAR | Status: AC
  Filled 2013-12-24: qty 1

## 2013-12-24 MED ORDER — INFLUENZA VAC SPLIT QUAD 0.5 ML IM SUSY
0.5000 mL | PREFILLED_SYRINGE | INTRAMUSCULAR | Status: AC
  Administered 2013-12-25: 0.5 mL via INTRAMUSCULAR

## 2013-12-24 MED ORDER — MENTHOL 3 MG MT LOZG
1.0000 | LOZENGE | OROMUCOSAL | Status: DC | PRN
  Administered 2013-12-25: 3 mg via ORAL
  Filled 2013-12-24: qty 9

## 2013-12-24 MED ORDER — LACTATED RINGERS IV SOLN
INTRAVENOUS | Status: DC
  Administered 2013-12-24 (×8): via INTRAVENOUS

## 2013-12-24 MED ORDER — HYDROMORPHONE HCL 1 MG/ML IJ SOLN
0.2500 mg | INTRAMUSCULAR | Status: DC | PRN
  Administered 2013-12-24: 0.5 mg via INTRAVENOUS

## 2013-12-24 MED ORDER — GLYCOPYRROLATE 0.2 MG/ML IJ SOLN
INTRAMUSCULAR | Status: AC
  Filled 2013-12-24: qty 1

## 2013-12-24 MED ORDER — DOCUSATE SODIUM 100 MG PO CAPS
100.0000 mg | ORAL_CAPSULE | Freq: Two times a day (BID) | ORAL | Status: DC
  Administered 2013-12-25 – 2013-12-26 (×4): 100 mg via ORAL
  Filled 2013-12-24 (×4): qty 1

## 2013-12-24 MED ORDER — LACTATED RINGERS IV SOLN
INTRAVENOUS | Status: DC
  Administered 2013-12-24: 22:00:00 via INTRAVENOUS

## 2013-12-24 MED ORDER — LIDOCAINE HCL (CARDIAC) 20 MG/ML IV SOLN
INTRAVENOUS | Status: AC
  Filled 2013-12-24: qty 5

## 2013-12-24 MED ORDER — FENTANYL CITRATE 0.05 MG/ML IJ SOLN
INTRAMUSCULAR | Status: DC | PRN
  Administered 2013-12-24: 50 ug via INTRAVENOUS
  Administered 2013-12-24: 100 ug via INTRAVENOUS
  Administered 2013-12-24 (×3): 50 ug via INTRAVENOUS
  Administered 2013-12-24 (×2): 100 ug via INTRAVENOUS

## 2013-12-24 MED ORDER — CEFAZOLIN SODIUM-DEXTROSE 2-3 GM-% IV SOLR
INTRAVENOUS | Status: AC
  Filled 2013-12-24: qty 50

## 2013-12-24 MED ORDER — HYDROMORPHONE HCL 1 MG/ML IJ SOLN
INTRAMUSCULAR | Status: DC | PRN
  Administered 2013-12-24: 1 mg via INTRAVENOUS

## 2013-12-24 MED ORDER — FENTANYL CITRATE 0.05 MG/ML IJ SOLN
INTRAMUSCULAR | Status: AC
  Filled 2013-12-24: qty 5

## 2013-12-24 MED ORDER — HYDROMORPHONE HCL 1 MG/ML IJ SOLN
INTRAMUSCULAR | Status: AC
  Filled 2013-12-24: qty 1

## 2013-12-24 MED ORDER — NEOSTIGMINE METHYLSULFATE 10 MG/10ML IV SOLN
INTRAVENOUS | Status: DC | PRN
  Administered 2013-12-24: 2 mg via INTRAVENOUS

## 2013-12-24 MED ORDER — PROMETHAZINE HCL 25 MG/ML IJ SOLN: 6.2500 mg | INTRAMUSCULAR | Status: DC | PRN

## 2013-12-24 MED ORDER — KETOROLAC TROMETHAMINE 30 MG/ML IJ SOLN: 15.0000 mg | Freq: Once | INTRAMUSCULAR | Status: DC | PRN

## 2013-12-24 MED ORDER — SCOPOLAMINE 1 MG/3DAYS TD PT72
MEDICATED_PATCH | TRANSDERMAL | Status: AC
  Administered 2013-12-24: 1.5 mg via TRANSDERMAL
  Filled 2013-12-24: qty 1

## 2013-12-24 MED ORDER — SODIUM CHLORIDE 0.9 % IJ SOLN: 9.0000 mL | INTRAMUSCULAR | Status: DC | PRN

## 2013-12-24 MED ORDER — 0.9 % SODIUM CHLORIDE (POUR BTL) OPTIME
TOPICAL | Status: DC | PRN
  Administered 2013-12-24: 1000 mL

## 2013-12-24 MED ORDER — ROCURONIUM BROMIDE 100 MG/10ML IV SOLN
INTRAVENOUS | Status: DC | PRN
  Administered 2013-12-24: 50 mg via INTRAVENOUS
  Administered 2013-12-24 (×2): 5 mg via INTRAVENOUS

## 2013-12-24 MED ORDER — DEXAMETHASONE SODIUM PHOSPHATE 4 MG/ML IJ SOLN
INTRAMUSCULAR | Status: AC
  Filled 2013-12-24: qty 1

## 2013-12-24 MED ORDER — ONDANSETRON HCL 4 MG/2ML IJ SOLN
4.0000 mg | Freq: Four times a day (QID) | INTRAMUSCULAR | Status: DC | PRN
Start: 2013-12-24 — End: 2013-12-26

## 2013-12-24 MED ORDER — VASOPRESSIN 20 UNIT/ML IJ SOLN
INTRAMUSCULAR | Status: DC | PRN
  Administered 2013-12-24 (×2): via INTRAMUSCULAR

## 2013-12-24 MED ORDER — ALUM & MAG HYDROXIDE-SIMETH 200-200-20 MG/5ML PO SUSP: 30.0000 mL | ORAL | Status: DC | PRN

## 2013-12-24 MED ORDER — GLYCOPYRROLATE 0.2 MG/ML IJ SOLN
INTRAMUSCULAR | Status: AC
  Filled 2013-12-24: qty 3

## 2013-12-24 MED ORDER — DIPHENHYDRAMINE HCL 12.5 MG/5ML PO ELIX: 12.5000 mg | ORAL_SOLUTION | Freq: Four times a day (QID) | ORAL | Status: DC | PRN

## 2013-12-24 MED ORDER — ROCURONIUM BROMIDE 100 MG/10ML IV SOLN
INTRAVENOUS | Status: AC
  Filled 2013-12-24: qty 1

## 2013-12-24 MED ORDER — ONDANSETRON HCL 4 MG/2ML IJ SOLN: INTRAMUSCULAR | Status: DC | PRN

## 2013-12-24 MED ORDER — ONDANSETRON HCL 4 MG PO TABS: 4.0000 mg | ORAL_TABLET | Freq: Four times a day (QID) | ORAL | Status: DC | PRN

## 2013-12-24 MED ORDER — SIMETHICONE 80 MG PO CHEW
80.0000 mg | CHEWABLE_TABLET | Freq: Four times a day (QID) | ORAL | Status: DC | PRN
  Administered 2013-12-26: 80 mg via ORAL
  Filled 2013-12-24: qty 1

## 2013-12-24 MED ORDER — MIDAZOLAM HCL 2 MG/2ML IJ SOLN
INTRAMUSCULAR | Status: AC
  Filled 2013-12-24: qty 2

## 2013-12-24 MED ORDER — OXYCODONE-ACETAMINOPHEN 5-325 MG PO TABS
1.0000 | ORAL_TABLET | ORAL | Status: DC | PRN
  Administered 2013-12-25 (×3): 2 via ORAL
  Administered 2013-12-26 (×2): 1 via ORAL
  Filled 2013-12-24: qty 1
  Filled 2013-12-24 (×2): qty 2
  Filled 2013-12-24: qty 1
  Filled 2013-12-24: qty 2

## 2013-12-24 MED ORDER — SODIUM CHLORIDE 0.9 % IJ SOLN
INTRAMUSCULAR | Status: AC
  Filled 2013-12-24: qty 100

## 2013-12-24 MED ORDER — ACETAMINOPHEN 160 MG/5ML PO SOLN
ORAL | Status: AC
  Administered 2013-12-24: 975 mg via ORAL
  Filled 2013-12-24: qty 40.6

## 2013-12-24 MED ORDER — LIDOCAINE HCL (CARDIAC) 20 MG/ML IV SOLN
INTRAVENOUS | Status: DC | PRN
  Administered 2013-12-24: 100 mg via INTRAVENOUS

## 2013-12-24 MED ORDER — DEXAMETHASONE SODIUM PHOSPHATE 10 MG/ML IJ SOLN: INTRAMUSCULAR | Status: DC | PRN

## 2013-12-24 MED ORDER — NORETHIN-ETH ESTRAD TRIPHASIC 0.5/1/0.5-35 MG-MCG PO TABS
1.0000 | ORAL_TABLET | Freq: Every day | ORAL | Status: DC
  Administered 2013-12-25 – 2013-12-26 (×2): 1 via ORAL

## 2013-12-24 MED ORDER — ONDANSETRON HCL 4 MG/2ML IJ SOLN: 4.0000 mg | Freq: Four times a day (QID) | INTRAMUSCULAR | Status: DC | PRN

## 2013-12-24 SURGICAL SUPPLY — 40 items
APL SKNCLS STERI-STRIP NONHPOA (GAUZE/BANDAGES/DRESSINGS)
BARRIER ADHS 3X4 INTERCEED (GAUZE/BANDAGES/DRESSINGS) ×1 IMPLANT
BENZOIN TINCTURE PRP APPL 2/3 (GAUZE/BANDAGES/DRESSINGS) ×1 IMPLANT
BRR ADH 4X3 ABS CNTRL BYND (GAUZE/BANDAGES/DRESSINGS) ×1
CANISTER SUCT 3000ML (MISCELLANEOUS) ×2 IMPLANT
CHLORAPREP W/TINT 26ML (MISCELLANEOUS) ×1 IMPLANT
CLOTH BEACON ORANGE TIMEOUT ST (SAFETY) ×2 IMPLANT
CONT PATH 16OZ SNAP LID 3702 (MISCELLANEOUS) ×2 IMPLANT
DRAPE CESAREAN BIRTH W POUCH (DRAPES) ×2 IMPLANT
DRAPE LAPAROTOMY 100X72X124 (DRAPES) IMPLANT
DRSG OPSITE POSTOP 4X12 (GAUZE/BANDAGES/DRESSINGS) ×1 IMPLANT
GAUZE SPONGE 4X4 16PLY XRAY LF (GAUZE/BANDAGES/DRESSINGS) ×1 IMPLANT
GLOVE BIO SURGEON STRL SZ7 (GLOVE) ×4 IMPLANT
GLOVE BIOGEL PI IND STRL 7.0 (GLOVE) ×1 IMPLANT
GLOVE BIOGEL PI INDICATOR 7.0 (GLOVE) ×2
GOWN STRL REUS W/TWL LRG LVL3 (GOWN DISPOSABLE) ×7 IMPLANT
HEMOSTAT SURGICEL 2X14 (HEMOSTASIS) IMPLANT
NS IRRIG 1000ML POUR BTL (IV SOLUTION) ×2 IMPLANT
PACK ABDOMINAL GYN (CUSTOM PROCEDURE TRAY) ×2 IMPLANT
PAD ABD 7.5X8 STRL (GAUZE/BANDAGES/DRESSINGS) ×1 IMPLANT
PAD OB MATERNITY 4.3X12.25 (PERSONAL CARE ITEMS) ×2 IMPLANT
SPONGE LAP 18X18 X RAY DECT (DISPOSABLE) ×4 IMPLANT
STAPLER VISISTAT 35W (STAPLE) IMPLANT
STRIP CLOSURE SKIN 1/4X4 (GAUZE/BANDAGES/DRESSINGS) ×1 IMPLANT
SUT CHROMIC 2 0 CT 1 (SUTURE) IMPLANT
SUT MON AB 3-0 SH 27 (SUTURE) ×2
SUT MON AB 3-0 SH27 (SUTURE) IMPLANT
SUT PDS AB 0 CTX 60 (SUTURE) ×2 IMPLANT
SUT PLAIN 2 0 XLH (SUTURE) IMPLANT
SUT VIC AB 0 CT1 27 (SUTURE) ×18
SUT VIC AB 0 CT1 27XBRD ANBCTR (SUTURE) ×6 IMPLANT
SUT VIC AB 0 CTX 36 (SUTURE) ×4
SUT VIC AB 0 CTX36XBRD ANBCTRL (SUTURE) IMPLANT
SUT VIC AB 2-0 CT1 (SUTURE) IMPLANT
SUT VIC AB 3-0 SH 27 (SUTURE) ×12
SUT VIC AB 3-0 SH 27X BRD (SUTURE) ×4 IMPLANT
SUT VIC AB 4-0 KS 27 (SUTURE) ×2 IMPLANT
TOWEL OR 17X24 6PK STRL BLUE (TOWEL DISPOSABLE) ×4 IMPLANT
TRAY FOLEY CATH 14FR (SET/KITS/TRAYS/PACK) ×2 IMPLANT
WATER STERILE IRR 1000ML POUR (IV SOLUTION) ×1 IMPLANT

## 2013-12-24 NOTE — Anesthesia Preprocedure Evaluation (Signed)
Anesthesia Evaluation  Patient identified by MRN, date of birth, ID band Patient awake    Reviewed: Allergy & Precautions, H&P , NPO status , Patient's Chart, lab work & pertinent test results, reviewed documented beta blocker date and time   History of Anesthesia Complications Negative for: history of anesthetic complications  Airway Mallampati: I TM Distance: >3 FB Neck ROM: full    Dental  (+) Teeth Intact   Pulmonary neg pulmonary ROS, neg shortness of breath,  breath sounds clear to auscultation  Pulmonary exam normal       Cardiovascular Exercise Tolerance: Good negative cardio ROS  Rhythm:regular Rate:Normal     Neuro/Psych negative neurological ROS  negative psych ROS   GI/Hepatic negative GI ROS, Neg liver ROS,   Endo/Other  Morbid obesity  Renal/GU negative Renal ROS  Female GU complaint     Musculoskeletal   Abdominal   Peds  Hematology negative hematology ROS (+)   Anesthesia Other Findings Type and screen is from 12/19/13.  Needs current sample.  Reproductive/Obstetrics negative OB ROS                           Anesthesia Physical Anesthesia Plan  ASA: III  Anesthesia Plan: General ETT   Post-op Pain Management:    Induction:   Airway Management Planned:   Additional Equipment:   Intra-op Plan:   Post-operative Plan:   Informed Consent: I have reviewed the patients History and Physical, chart, labs and discussed the procedure including the risks, benefits and alternatives for the proposed anesthesia with the patient or authorized representative who has indicated his/her understanding and acceptance.   Dental Advisory Given  Plan Discussed with: CRNA and Surgeon  Anesthesia Plan Comments:         Anesthesia Quick Evaluation

## 2013-12-24 NOTE — Interval H&P Note (Signed)
History and Physical Interval Note:  12/24/2013 8:59 AM  Rebecca Allison  has presented today for surgery, with the diagnosis of Fibroids/ABNORMAL UTERINE BLEEDING  The various methods of treatment have been discussed with the patient and family. After consideration of risks, benefits and other options for treatment, the patient has consented to  Procedure(s): MYOMECTOMY (N/A) as a surgical intervention .  The patient's history has been reviewed, patient examined, no change in status, stable for surgery.  I have reviewed the patient's chart and labs.  Questions were answered to the patient's satisfaction.     Simona Huh, Reise Hietala

## 2013-12-24 NOTE — Anesthesia Postprocedure Evaluation (Signed)
  Anesthesia Post-op Note  Patient: Rebecca Allison  Procedure(s) Performed: Procedure(s): MYOMECTOMY (N/A)  Patient Location: PACU  Anesthesia Type:General  Level of Consciousness: awake, alert  and oriented  Airway and Oxygen Therapy: Patient Spontanous Breathing  Post-op Pain: mild  Post-op Assessment: Post-op Vital signs reviewed, Patient's Cardiovascular Status Stable, Respiratory Function Stable, Patent Airway, No signs of Nausea or vomiting and Pain level controlled  Post-op Vital Signs: Reviewed and stable  Last Vitals:  Filed Vitals:   12/24/13 1345  BP: 128/66  Pulse: 95  Temp:   Resp: 30    Complications: No apparent anesthesia complications

## 2013-12-24 NOTE — H&P (Addendum)
History of Present Illness  General:  28 y/o G0 presents for open myomectomy on 12/24/13.  Pt was diagnosed with large fibroids in February when she was referred for an abnormal discharge. Pt denied h/o fibroids and was asymptomatic. F/u ultrasound was done 3 months later to make sure fibroids were stable; they were unchanged. After diagnosis, pt reported feeling abdominal fullness.  Periods have been heavy. Extra cramping.  Denies SOB, lightheaded or dizzy.  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   denies any GYN family cancer hx.  Social History  General:  Tobacco use  cigarettes: Never smoked Tobacco history last updated 12/14/2013 Smoking: no.  Tobacco Exposure: Aunt smoked around pt.  Alcohol: occasionally.  Caffeine: soda, tea.  Recreational drug use: no.  Exercise: minimal.  Occupation: [employer].  Education: graduated [employer] with an Administrator, arts.  Marital Status: single.  Children: none.  Seat belt use: always.   Gyn History  Sexual activity currently sexually active.  Periods : every 28 days.  LMP 12/05/13.  Birth control tri-normyl.  Last pap smear date 05/09/13, negative.  Denies H/O Last mammogram date.   Abnormal pap smear assessed with colposcopy.  STD Herpes.  Menarche 62.   OB History  Never been pregnant per patient.  Number of pregnancies 0.   Allergies  CR brand condoms  Metronidazole: stomach upset  Hospitalization/Major Diagnostic Procedure  none   Review of Systems  Denies fever/chills, chest pain, SOB, headaches, numbness/tingling. No h/o complication with anesthesia, bleeding disorders or blood clots.  Vital Signs  Wt 215, Wt change -4.2 lb, Ht 61.5, BMI 39.96, Pulse sitting 99, BP sitting 123/73.  Physical Examination  GENERAL:  Patient appears alert and oriented.  General Appearance: well-appearing, well-developed, no acute distress.  Speech: clear.  LUNGS:  Auscultation: no wheezing/rhonchi/rales. CTA bilaterally.  HEART:  Heart sounds: normal. RRR. no murmur.  ABDOMEN:  General: soft nontender, nondistended, no masses.  FEMALE GENITOURINARY:  Pelvic Cervix normal, blood tinged clear mucoid discharge, no odor, Fibroid uterus 4-5 FB above umbilicus.  EXTREMITIES:  General: No edema or calf tenderness.    Assessments  1. Pre-op exam - V72.84  2. Fibroids - 218.9  3. Discharge of vagina - 623.5  Treatment  1. Pre-op exam  Notes: R/B/A of procedure discussed with pt at length. All questions answered. Consent obtained. Discussed likelyhood of a midline incision due to such a large uterus. Pt consented on blood transfusion due to risk of bleeding. Informed fibroids may be left in place if risky areas.  Pt also reminded that she would need cesarean sections for future deliveries.  Discussed possibility of hysterectomy if bleeding can not be stopped. Pt  offered Lupron but declined. Pt verbalized understanding and desires to proceed.    2. Discharge of vagina  LAB: [facility]  Lab: [facility]  WBCS Moderately increased  -   CLUE CELLS None seen  -   TRICHOMONAS None Seen  -   YEAST None seen  -            Procedure Codes  87210 ECL WET PREP    Follow Up  2 Weeks post op

## 2013-12-24 NOTE — Brief Op Note (Signed)
12/24/2013  1:23 PM  PATIENT:  Rebecca Allison  28 y.o. female  PRE-OPERATIVE DIAGNOSIS:  Fibroids  POST-OPERATIVE DIAGNOSIS:  Same  PROCEDURE:  Procedure(s): MYOMECTOMY (N/A), Open (7)  SURGEON:  Surgeon(s) and Role:    * Thurnell Lose, MD - Primary    * Maeola Sarah. Landry Mellow, MD - Assisting  PHYSICIAN ASSISTANT: Dr. Landry Mellow  ASSISTANTS: Technician   ANESTHESIA:   general  EBL:  Total I/O In: 2446 [I.V.:3650] Out: 1500 [Urine:200; Blood:1300]  BLOOD ADMINISTERED:none  DRAINS: Urinary Catheter (Foley)   LOCAL MEDICATIONS USED:  OTHER Vasopressin 20 units/100 NS  SPECIMEN:  Source of Specimen:  Uterine fibroids, serosa of uterus Largest fibroid ~ 680 grams, All fibroids ~1400 grams total   DISPOSITION OF SPECIMEN:  PATHOLOGY  COUNTS:  YES  TOURNIQUET:  * No tourniquets in log *  DICTATION: .Other Dictation: Dictation Number [id number]  PLAN OF CARE: Admit to inpatient   PATIENT DISPOSITION:  PACU - hemodynamically stable.   Delay start of Pharmacological VTE agent (>24hrs) due to surgical blood loss or risk of bleeding: yes

## 2013-12-24 NOTE — Transfer of Care (Signed)
Immediate Anesthesia Transfer of Care Note  Patient: Rebecca Allison  Procedure(s) Performed: Procedure(s): MYOMECTOMY (N/A)  Patient Location: PACU  Anesthesia Type:General  Level of Consciousness: awake, alert , oriented and patient cooperative  Airway & Oxygen Therapy: Patient Spontanous Breathing and Patient connected to face mask oxygen  Post-op Assessment: Report given to PACU RN and Post -op Vital signs reviewed and stable  Post vital signs: Reviewed and stable  Complications: No apparent anesthesia complications

## 2013-12-24 NOTE — Progress Notes (Signed)
In to assess pt POD #0. Pt with c/o pain relieved somewhat with Dilaudid PCA.  Toradol has not been given.  Pain is throbbing. VSS UOP 350/3 hrs, urine clear Hg 10.4, platelets 234 Gen:  Pt appears comfortable. Dressing Dry. S/p open myomectomy 1300 EBL. UOP adequate. No s/sxs of active bleeding. Toradol IV for pain.

## 2013-12-25 ENCOUNTER — Encounter (HOSPITAL_COMMUNITY): Payer: Self-pay | Admitting: Obstetrics and Gynecology

## 2013-12-25 LAB — CBC
HEMATOCRIT: 25.9 % — AB (ref 36.0–46.0)
Hemoglobin: 8.7 g/dL — ABNORMAL LOW (ref 12.0–15.0)
MCH: 26.8 pg (ref 26.0–34.0)
MCHC: 33.6 g/dL (ref 30.0–36.0)
MCV: 79.7 fL (ref 78.0–100.0)
Platelets: 233 10*3/uL (ref 150–400)
RBC: 3.25 MIL/uL — ABNORMAL LOW (ref 3.87–5.11)
RDW: 14.9 % (ref 11.5–15.5)
WBC: 18.2 10*3/uL — ABNORMAL HIGH (ref 4.0–10.5)

## 2013-12-25 MED ORDER — POLYSACCHARIDE IRON COMPLEX 150 MG PO CAPS
150.0000 mg | ORAL_CAPSULE | Freq: Every day | ORAL | Status: DC
  Administered 2013-12-25 – 2013-12-26 (×2): 150 mg via ORAL
  Filled 2013-12-25 (×3): qty 1

## 2013-12-25 NOTE — Op Note (Signed)
[name], [name].:  [id number]  MEDICAL RECORD NO.:  [id number]  LOCATION:  9304                          FACILITY:  [facility]  PHYSICIAN:  Jola Schmidt, MD   DATE OF BIRTH:  12-31-1985  DATE OF PROCEDURE:  12/24/2013 DATE OF DISCHARGE:                              OPERATIVE REPORT   PREOPERATIVE DIAGNOSIS:  Uterine fibroids.  POSTOPERATIVE DIAGNOSIS:  Uterine fibroids.  PROCEDURE:  Open myomectomy (7 fibroids removed).  SURGEON:  Jola Schmidt, MD.  PHYSICIAN ASSISTANT:  Dr. Landry Mellow assisting as well as Merchant navy officer.  ANESTHESIA:  General.  ESTIMATED BLOOD LOSS:  1300 of blood, 3650 of crystalloid.  URINE OUTPUT:  Approximately 200 mL at the end of the case.  DRAINS:  Foley.  LOCAL:  Vasopressin 20 units and 100 of normal saline, less than 30 mL used.  SPECIMEN:  Uterine fibroids and serosa of the uterus.  Largest fibroid weighed approximately 680 g.  All fibroids were 1400 g total. Disposition of specimen to Pathology.  COUNTS:  All counts correct.  DISPOSITION:  To PACU hemodynamically stable.  COMPLICATIONS:  None.  FINDINGS:  A large posterior fibroid removed, 2 fundal fibroids, 1 pedunculated on anterior, right 2 pedunculated on the anterior left cornu, 1 superficial on the anterior surface of the uterus.  Normal fallopian tubes and ovaries bilaterally.  Remaining fibroids were 1 on the left posterior in the lower uterus near the uterine artery, 1 on the right side that was very similar and then 1 anteriorly at the lower uterine segment right above the cervix.  All the remaining fibroid left were approximately 1 cm.  PROCEDURE IN DETAIL:  Ms. Rebecca Allison was taken to the operating room, where she underwent general anesthesia without complication.  Once the patient was relaxed, I palpated the uterine fundus and was approximately 4 inches below the xiphoid process.  For that reason, it was decided to proceed with a vertical  incision.  The patient was then draped after the abdomen was marked for a midline incision up to the xiphoid and around the periumbilical incision.  The scalpel was then used to incise the lower abdomen vertically.  The incision was opened with the Bovie down to the fascia.  The patient was approximately 3-4 inches deep.  Once the fascia was identified, it was incised with the Bovie and extended with aid of the hemostatic.  The rectus muscles were separated in midline and it was very easy to identify.  Peritoneum was tented up sharply with hemostats and entered with the Metzenbaum scissors.  The incision was then stretched.  I attempted to deliver the fibroid uterus through the incision.  There was some difficulty, so the fascia incision was extended to the periumbilical region and was able to deliver the fibroid.  We took out the largest fibroid that measured almost 11 cm on the ultrasound in June, which was a few months ago.  It was very superficial after vasopressin was injected.  The uterine serosa was incised with the Bovie.  I initially thought the portion that we were able to see was all of the fibroids, however, the largest fibroid extended the length of the posterior portion  of the uterus and stopped at the lower uterine segment.  It was posterior in nature, it did not enter into the abdominal cavity, but the incision was extended from the fundus to posteriorly because of the size of the fibroid.  Zero Vicryl was used to close all the layers of the uterus.  There was significant bleeding from this portion of the procedure.  An 0 Vicryl was used in layers until hemostasis was achieved.  We closed it and we were ready to close the serosa and then bleeding resumed again, also there were some mattress sutures that were placed for hemostasis, which was adequate.  There was no evidence of a hematoma or expansion of the hematoma.  Serosa was then reapproximated with 3-0 Monocryl in a  baseball stitch fashion. The uterus appeared to be rotated where the right fallopian tube and ovary appeared to be midline and ended up.  There was a fibroid on the anterior cornu of the uterus that was stuck under the symphysis pubis and that was released and normal anatomy was restored.  The remaining portion of the myomectomy, we used vasopressin prior to injection.  All of them were pedunculated except for 1 that was quite superficial in the anterior space.  The pedunculated ones were shaved off with the Bovie.  The subserosal fibroid was grasped with a towel clip and removed atraumatically.  Minimum bleeding was noted from all of the sites.  The serosa was reapproximated also with a 3-0 Monocryl. Once there were few fibroids that were noted were near the left fallopian tube and in the broad ligament, we decide to leave those due to the location.  Interceed was applied to the posterior portion of the cervix and the anterior portion of the cervix after copious irrigation was performed during the procedure.  There was a moistened laparotomy sponge that was placed posteriorly and to retract the bladder while we were working on the anterior fibroids.  The fascia malleable was placed in the abdomen.  The fascia was reapproximated with 0 PDS loop.  We did that closure two.  The subcutaneous space was closed with 2 layers with a running suture and then subcuticular layer was reapproximated to relieve tension on the staples.  Skin was then reapproximated with staples and a pressure dressing was applied.  All instrument, sponge, and needle counts were correct x3. The patient received cefotetan 2 g prior to the procedure.  SCDs were on and operating throughout the case.  Her vital signs were stable throughout.Jola Schmidt, MD     EBV/MEDQ  D:  12/24/2013  T:  12/25/2013  Job:  [id number]

## 2013-12-25 NOTE — Progress Notes (Signed)
Subjective: Postop Day 1: Open Myomectomy No complaints.  Pain controlled.  Ambulation in halls overnight. Tolerated clear liquids. No flatus yet, belching.  Denies light headedness or dizziness.  Objective: Temp:  [97.8 F (36.6 C)-98.6 F (37 C)] 98.1 F (36.7 C) (09/29 0533) Pulse Rate:  [94-114] 95 (09/29 0533) Resp:  [16-21] 18 (09/29 0752) BP: (108-132)/(59-76) 108/59 mmHg (09/29 0533) SpO2:  [97 %-100 %] 97 % (09/29 0752) Weight:  [97.977 kg (216 lb)] 97.977 kg (216 lb) (09/28 1510) UOP Good  Physical Exam: Gen: NAD Abdomen:  Distended, not excessively tender, +BS-active Incision:Dressing Clean/dry DVT Evaluation: SCDs on.    Recent Labs  12/24/13 1733 12/25/13 0715  HGB 10.4* 8.7*  HCT 31.0* 25.9*    Assessment/Plan: Status post open myomectomy. Anemia due to blood loss with surgery.  Asymptomatic.  Iron daily. Diet advanced. Remove one IV.  Saline lock the remaining IV. Spencer PCA and Foley.  Start PO medications. Encouraged ambulation. Anticipate discharge tomorrow.    Thurnell Lose 12/25/2013, 8:41 AM

## 2013-12-25 NOTE — Discharge Instructions (Signed)
Myomectomy, Care After  Refer to this sheet in the next few weeks. These instructions provide you with information on caring for yourself after your procedure. Your health care provider may also give you more specific instructions. Your treatment has been planned according to current medical practices, but problems sometimes occur. Call your health care provider if you have any problems or questions after your procedure.  WHAT TO EXPECT AFTER THE PROCEDURE  After your procedure, it is typical to have the following:  · Pain in your abdomen, especially at any incision sites. You will be given pain medicine to control the pain.  · Tiredness. This is a normal part of the recovery process. Your energy level will return to normal over the next several weeks.  · Constipation.  · Vaginal bleeding. This is normal and should stop after 1-2 weeks.  HOME CARE INSTRUCTIONS   · Only take over-the-counter or prescription medicines as directed by your health care provider. Avoid aspirin because it can cause bleeding.  · Do not douche, use tampons, or have sexual intercourse until given permission by your health care provider.  · Remove or change any bandages (dressings) as directed by your health care provider.  · Take showers instead of baths as directed by your health care provider.  · You will probably be able to go back to your normal routine after a few days. Do not do anything that requires extra effort until your health care provider says it is okay. Do not lift anything heavier than 15 pounds (6.8 kg) until your health care provider approves.  · Walk daily but take frequent rest breaks if you tire easily.  · Continue to practice deep breathing and coughing. If it hurts to cough, try holding a pillow against your belly as you cough.  · If you become constipated, you may:  ¨ Use a mild laxative if your health care provider approves.  ¨ Add more fruit and bran to your diet.  ¨ Drink enough fluids to keep your urine clear or  pale yellow.  · Take your temperature twice a day and write it down.  · Do not drink alcohol.  · Do not drive until your health care provider approves.  · Have someone help you at home for 1 week or until you can do your own household activities.  · Follow up with your health care provider as directed.  SEEK MEDICAL CARE IF:  · You have a fever.  · You have increasing abdominal pain that is not relieved with medicine.  · You have nausea, vomiting, or diarrhea.  · You have pain when you urinate, or you have blood in your urine.  · You have a rash on your body.  · You have pain or redness where your IV access tube was inserted.  · You have redness, swelling, or any kind of drainage from an incision.  SEEK IMMEDIATE MEDICAL CARE IF:   · You have weakness or lightheadedness.  · You have pain, swelling, or redness in your legs.  · You have chest pain.  · You faint.  · You have shortness of breath.  · You have heavy vaginal bleeding.  · Your incision is opening up.  Document Released: 08/05/2010 Document Revised: 01/03/2013 Document Reviewed: 10/25/2012  ExitCare® Patient Information ©2015 [employer]. This information is not intended to replace advice given to you by your health care provider. Make sure you discuss any questions you have with your health care provider.

## 2013-12-25 NOTE — Addendum Note (Signed)
Addendum created 12/25/13 0912 by Flossie Dibble, CRNA   Modules edited: Notes Section   Notes Section:  File: [id number]

## 2013-12-25 NOTE — Anesthesia Postprocedure Evaluation (Signed)
Anesthesia Post Note  Patient: Rebecca Allison  Procedure(s) Performed: Procedure(s): MYOMECTOMY (N/A)  Anesthesia type: General  Patient location: [facility]  Post pain: Pain level controlled  Post assessment: Post-op Vital signs reviewed  Last Vitals: BP 108/59  Pulse 95  Temp(Src) 36.7 C (Oral)  Resp 18  Ht 5\' 1"  (1.549 m)  Wt 216 lb (97.977 kg)  BMI 40.83 kg/m2  SpO2 97%  LMP 12/05/2013  Post vital signs: Reviewed  Level of consciousness: awake  Complications: No apparent anesthesia complications

## 2013-12-26 LAB — CBC
HCT: 24.1 % — ABNORMAL LOW (ref 36.0–46.0)
Hemoglobin: 8 g/dL — ABNORMAL LOW (ref 12.0–15.0)
MCH: 26.8 pg (ref 26.0–34.0)
MCHC: 33.2 g/dL (ref 30.0–36.0)
MCV: 80.9 fL (ref 78.0–100.0)
PLATELETS: 222 10*3/uL (ref 150–400)
RBC: 2.98 MIL/uL — ABNORMAL LOW (ref 3.87–5.11)
RDW: 15.2 % (ref 11.5–15.5)
WBC: 12.7 10*3/uL — ABNORMAL HIGH (ref 4.0–10.5)

## 2013-12-26 MED ORDER — POLYSACCHARIDE IRON COMPLEX 150 MG PO CAPS: 150.0000 mg | ORAL_CAPSULE | Freq: Three times a day (TID) | ORAL | Status: DC

## 2013-12-26 MED ORDER — OXYCODONE-ACETAMINOPHEN 5-325 MG PO TABS: 1.0000 | ORAL_TABLET | ORAL | Status: DC | PRN

## 2013-12-26 MED ORDER — IBUPROFEN 600 MG PO TABS: 600.0000 mg | ORAL_TABLET | Freq: Four times a day (QID) | ORAL | Status: AC | PRN

## 2013-12-26 NOTE — Progress Notes (Signed)
Spoke with RN via phone.  She states pt is dizzy with ambulation.  Unsure if it is Percocet or anemia.   BP lower than baseline and mildly tachycardiac overnight. Will exam this am and discuss blood transfusion.

## 2013-12-26 NOTE — Progress Notes (Signed)
Discharge teaching complete. Pt understood all instructions and all questions were answered. Pt pushed via wheelchair out of the hospital and discharged home to family.

## 2013-12-26 NOTE — Discharge Summary (Signed)
Physician Discharge Summary  Patient ID: Rebecca Allison MRN: [id number] DOB/AGE: [birth year] 28 y.o.  Admit date: 12/24/2013 Discharge date: 12/26/2013  Admission Diagnoses:  Uterine fibroids   Discharge Diagnoses: S/p Myomectomy, Severe anemia Active Problems:   Fibroids   Discharged Condition: fair  Hospital Course: EBL 1300, Hg ~13 to ~8.  Symptomatic anemia on day of discharge.  Declined blood transfusion in lieu of iron supplementation.  See progress note.  Consults: None  Significant Diagnostic Studies: labs: see above  Treatments: Open myomectomy, largest fibroid 670 grams.  Iron supplementation.  Discharge Exam: Blood pressure 105/70, pulse 102, temperature 98.4 F (36.9 C), temperature source Oral, resp. rate 18, height 5\' 1"  (1.549 m), weight 97.977 kg (216 lb), last menstrual period 12/05/2013, SpO2 100.00%. See progress note.  Disposition: 01-Home or Self Care  Discharge Instructions   Call MD for:  difficulty breathing, headache or visual disturbances    Complete by:  As directed      Call MD for:  persistant dizziness or light-headedness    Complete by:  As directed      Call MD for:  persistant nausea and vomiting    Complete by:  As directed      Call MD for:  redness, tenderness, or signs of infection (pain, swelling, redness, odor or green/yellow discharge around incision site)    Complete by:  As directed      Call MD for:  severe uncontrolled pain    Complete by:  As directed      Diet - low sodium heart healthy    Complete by:  As directed      Discharge instructions    Complete by:  As directed   See discharge instructions.     Discharge wound care:    Complete by:  As directed   Keep incision clean and dry.     Increase activity slowly    Complete by:  As directed      Lifting restrictions    Complete by:  As directed   No lifting greater than 15 lbs.     Sexual Activity Restrictions    Complete by:  As directed   Pelvic rest x 6 weeks.            Medication List         ibuprofen 600 MG tablet  Commonly known as:  ADVIL,MOTRIN  Take 1 tablet (600 mg total) by mouth every 6 (six) hours as needed (mild pain).     iron polysaccharides 150 MG capsule  Commonly known as:  NIFEREX  Take 1 capsule (150 mg total) by mouth 3 (three) times daily.     oxyCODONE-acetaminophen 5-325 MG per tablet  Commonly known as:  PERCOCET/ROXICET  Take 1-2 tablets by mouth every 4 (four) hours as needed (moderate to severe pain (when tolerating fluids)).     TRI-NORINYL (28) 0.5/1/0.5-35 MG-MCG tablet  Generic drug:  Norethindrone-Ethinyl Estradiol Triphasic  Take 1 tablet by mouth daily.     valACYclovir 500 MG tablet  Commonly known as:  VALTREX  Take 500 mg by mouth as needed (outbreak).           Follow-up Information   Follow up with Thurnell Lose, MD. Schedule an appointment as soon as possible for a visit on 12/31/2013. (Remove staples)    Specialty:  Obstetrics and Gynecology   Contact information:   [address], [address] [place] [address] [phone]       Signed:  Thurnell Lose 12/26/2013, 2:28 PM

## 2013-12-26 NOTE — Progress Notes (Signed)
Pt states she has dizziness with ambulation.  Felt like she was going to pass out while brushing her teeth.  Had to sit down.  SOB with walking.  Tolerating regular diet, +flatus.  VS:  BP has trended down.  Mildly tachycardiac overnight. Gen:  NAD,  Appears somnolent CV:  Tachycardiac Lungs: CTA L Abdomen:  Dressing Dry. +BS, distended. Ext:  No calf tenderness.  Hg 8.7 to 8 in 24 hours.  S/p Open myomectomy. Anemia due to blood loss, now symptomatic. Recommended blood transfusion.  Discussed R/B/A.  Pt unsure.  Wants to consider po iron.  Mother present during counseling.  They will talk and decide.

## 2013-12-27 ENCOUNTER — Encounter (HOSPITAL_COMMUNITY): Payer: Self-pay | Admitting: *Deleted

## 2013-12-27 ENCOUNTER — Inpatient Hospital Stay (HOSPITAL_COMMUNITY)
Admission: AD | Admit: 2013-12-27 | Discharge: 2013-12-27 | Disposition: A | Discharge status: Home / Self Care | Payer: 59 | Source: Ambulatory Visit | Attending: Obstetrics and Gynecology | Admitting: Obstetrics and Gynecology

## 2013-12-27 DIAGNOSIS — N938 Other specified abnormal uterine and vaginal bleeding: Secondary | ICD-10-CM | POA: Diagnosis present

## 2013-12-27 DIAGNOSIS — D6489 Other specified anemias: Secondary | ICD-10-CM | POA: Insufficient documentation

## 2013-12-27 LAB — BASIC METABOLIC PANEL
Anion gap: 11 (ref 5–15)
BUN: 8 mg/dL (ref 6–23)
CALCIUM: 8.2 mg/dL — AB (ref 8.4–10.5)
CO2: 23 mEq/L (ref 19–32)
CREATININE: 0.94 mg/dL (ref 0.50–1.10)
Chloride: 105 mEq/L (ref 96–112)
GFR calc non Af Amer: 81 mL/min — ABNORMAL LOW (ref >90)
GLUCOSE: 109 mg/dL — AB (ref 70–99)
Potassium: 3.5 mEq/L — ABNORMAL LOW (ref 3.7–5.3)
Sodium: 139 mEq/L (ref 137–147)

## 2013-12-27 LAB — CBC WITH DIFFERENTIAL/PLATELET
BASOS PCT: 0 % (ref 0–1)
Basophils Absolute: 0 10*3/uL (ref 0.0–0.1)
EOS ABS: 0.2 10*3/uL (ref 0.0–0.7)
Eosinophils Relative: 2 % (ref 0–5)
HCT: 24 % — ABNORMAL LOW (ref 36.0–46.0)
HEMOGLOBIN: 7.9 g/dL — AB (ref 12.0–15.0)
LYMPHS ABS: 3 10*3/uL (ref 0.7–4.0)
Lymphocytes Relative: 24 % (ref 12–46)
MCH: 26.6 pg (ref 26.0–34.0)
MCHC: 32.9 g/dL (ref 30.0–36.0)
MCV: 80.8 fL (ref 78.0–100.0)
Monocytes Absolute: 1.1 10*3/uL — ABNORMAL HIGH (ref 0.1–1.0)
Monocytes Relative: 8 % (ref 3–12)
NEUTROS ABS: 8.5 10*3/uL — AB (ref 1.7–7.7)
NEUTROS PCT: 66 % (ref 43–77)
Platelets: 266 10*3/uL (ref 150–400)
RBC: 2.97 MIL/uL — AB (ref 3.87–5.11)
RDW: 15.1 % (ref 11.5–15.5)
WBC: 12.8 10*3/uL — ABNORMAL HIGH (ref 4.0–10.5)

## 2013-12-27 LAB — URINALYSIS, ROUTINE W REFLEX MICROSCOPIC
Bilirubin Urine: NEGATIVE
Glucose, UA: NEGATIVE mg/dL
Ketones, ur: NEGATIVE mg/dL
Nitrite: NEGATIVE
PROTEIN: NEGATIVE mg/dL
Specific Gravity, Urine: 1.01 (ref 1.005–1.030)
UROBILINOGEN UA: 0.2 mg/dL (ref 0.0–1.0)
pH: 7 (ref 5.0–8.0)

## 2013-12-27 LAB — TYPE AND SCREEN
ABO/RH(D): O POS
ABO/RH(D): O POS
Antibody Screen: NEGATIVE
Antibody Screen: NEGATIVE
UNIT DIVISION: 0
Unit division: 0

## 2013-12-27 LAB — URINE MICROSCOPIC-ADD ON

## 2013-12-27 MED ORDER — SODIUM CHLORIDE 0.9 % IV SOLN
INTRAVENOUS | Status: DC
  Administered 2013-12-27: 20:00:00 via INTRAVENOUS

## 2013-12-27 MED ORDER — SODIUM CHLORIDE 0.9 % IV SOLN
INTRAVENOUS | Status: DC
  Administered 2013-12-27: 18:00:00 via INTRAVENOUS

## 2013-12-27 NOTE — Discharge Instructions (Signed)
Anemia, Nonspecific Anemia is a condition in which the concentration of red blood cells or hemoglobin in the blood is below normal. Hemoglobin is a substance in red blood cells that carries oxygen to the tissues of the body. Anemia results in not enough oxygen reaching these tissues.  CAUSES  Common causes of anemia include:   Excessive bleeding. Bleeding may be internal or external. This includes excessive bleeding from periods (in women) or from the intestine.   Poor nutrition.   Chronic kidney, thyroid, and liver disease.  Bone marrow disorders that decrease red blood cell production.  Cancer and treatments for cancer.  HIV, AIDS, and their treatments.  Spleen problems that increase red blood cell destruction.  Blood disorders.  Excess destruction of red blood cells due to infection, medicines, and autoimmune disorders. SIGNS AND SYMPTOMS   Minor weakness.   Dizziness.   Headache.  Palpitations.   Shortness of breath, especially with exercise.   Paleness.  Cold sensitivity.  Indigestion.  Nausea.  Difficulty sleeping.  Difficulty concentrating. Symptoms may occur suddenly or they may develop slowly.  DIAGNOSIS  Additional blood tests are often needed. These help your health care provider determine the best treatment. Your health care provider will check your stool for blood and look for other causes of blood loss.  TREATMENT  Treatment varies depending on the cause of the anemia. Treatment can include:   Supplements of iron, vitamin B12, or folic acid.   Hormone medicines.   A blood transfusion. This may be needed if blood loss is severe.   Hospitalization. This may be needed if there is significant continual blood loss.   Dietary changes.  Spleen removal. HOME CARE INSTRUCTIONS Keep all follow-up appointments. It often takes many weeks to correct anemia, and having your health care provider check on your condition and your response to  treatment is very important. SEEK IMMEDIATE MEDICAL CARE IF:   You develop extreme weakness, shortness of breath, or chest pain.   You become dizzy or have trouble concentrating.  You develop heavy vaginal bleeding.   You develop a rash.   You have bloody or black, tarry stools.   You faint.   You vomit up blood.   You vomit repeatedly.   You have abdominal pain.  You have a fever or persistent symptoms for more than 2-3 days.   You have a fever and your symptoms suddenly get worse.   You are dehydrated.  MAKE SURE YOU:  Understand these instructions.  Will watch your condition.  Will get help right away if you are not doing well or get worse. Document Released: 04/22/2004 Document Revised: 11/15/2012 Document Reviewed: 09/08/2012 ExitCare Patient Information 2015 [employer]. This information is not intended to replace advice given to you by your health care provider. Make sure you discuss any questions you have with your health care provider.  

## 2013-12-27 NOTE — MAU Note (Addendum)
Had a myomectomy on Monday.went home 09/30.  Has been having incisional pain. Feels tired. Woke up with a slight headache and chest pain (tightness) this morning.  (headache is gone now) Left leg felt weird this morning, is fine now. (gait steady).  Has seen blood in her urine and some blood on pad today.

## 2013-12-27 NOTE — MAU Provider Note (Signed)
History     CSN: [id number]  Arrival date and time: 12/27/13 1605   First Provider Initiated Contact with Patient 12/27/13 1638      Chief Complaint  Patient presents with  . Post-op Problem  . Shortness of Breath  . Headache   HPI  Pertinent Gynecological History: Menses: flow is excessive with use of and had fibroids and that was the reason for the surgery pads or tampons on heaviest days Bleeding: dysfunctional uterine bleeding Contraception: OCP (estrogen/progesterone) Blood transfusions: none Sexually transmitted diseases: Chlamydia, trichomonas, HSV Previous GYN Procedures:myomectomy  Last mammogram: 2015 and normal Last pap: normal Date: 05/2013  Rebecca Allison is a 28 y.o. female who has never been pregnant presents to the MAU s/p surgery 3 days ago for fibroids. She states she was discharged yesterday after she decided not to be transfused and just go home and take Fe. Today she has started having chest pain, headache and feeling weak and dizzy. She spoke with the nurse in he office and was told to come to MAU. Her Hgb was 8 prior to d/c.   Past Medical History  Diagnosis Date  . Shortness of breath     when climbing over 3 flts stairs    Past Surgical History  Procedure Laterality Date  . No past surgeries    . Myomectomy N/A 12/24/2013    Procedure: MYOMECTOMY;  Surgeon: Thurnell Lose, MD;  Location: [facility];  Service: Gynecology;  Laterality: N/A;    History reviewed. No pertinent family history.  History  Substance Use Topics  . Smoking status: Never Smoker   . Smokeless tobacco: Not on file  . Alcohol Use: No    Allergies:  Allergies  Allergen Reactions  . Metronidazole Nausea And Vomiting    Prescriptions prior to admission  Medication Sig Dispense Refill  . ibuprofen (ADVIL,MOTRIN) 600 MG tablet Take 1 tablet (600 mg total) by mouth every 6 (six) hours as needed (mild pain).  30 tablet  1  . iron polysaccharides (NIFEREX) 150 MG capsule  Take 1 capsule (150 mg total) by mouth 3 (three) times daily.  60 capsule  1  . Norethindrone-Ethinyl Estradiol Triphasic (TRI-NORINYL, 28,) 0.5/1/0.5-35 MG-MCG tablet Take 1 tablet by mouth daily.      Marland Kitchen oxyCODONE-acetaminophen (PERCOCET/ROXICET) 5-325 MG per tablet Take 1-2 tablets by mouth every 4 (four) hours as needed (moderate to severe pain (when tolerating fluids)).  30 tablet  0  . valACYclovir (VALTREX) 500 MG tablet Take 500 mg by mouth daily as needed (for outbreak.).         ROS Physical Exam   Blood pressure 123/73, pulse 114, temperature 98.3 F (36.8 C), temperature source Oral, resp. rate 16, last menstrual period 12/05/2013, SpO2 100.00%.  Physical Exam  Constitutional: She is oriented to person, place, and time. She appears well-developed and well-nourished. No distress.  HENT:  Head: Normocephalic and atraumatic.  Eyes: EOM are normal.  Neck: Neck supple.  Cardiovascular: Normal rate.   Respiratory: Effort normal.  GI: Soft. There is no tenderness.  Genitourinary: Vagina normal.  Musculoskeletal: Normal range of motion.  Neurological: She is alert and oriented to person, place, and time. She has normal strength. No cranial nerve deficit or sensory deficit. She displays a negative Romberg sign. Coordination normal.  Reflex Scores:      Bicep reflexes are 2+ on the right side and 2+ on the left side.      Brachioradialis reflexes are 2+ on the right side and  2+ on the left side.      Patellar reflexes are 2+ on the right side and 2+ on the left side. Ambulatory with steady gait no foot drag.   Skin: Skin is warm and dry.  Psychiatric: She has a normal mood and affect. Her behavior is normal. Judgment and thought content normal.    MAU Course  Procedures Results for orders placed during the hospital encounter of 12/27/13 (from the past 24 hour(s))  CBC WITH DIFFERENTIAL     Status: Abnormal   Collection Time    12/27/13  5:35 PM      Result Value Ref Range    WBC 12.8 (*) 4.0 - 10.5 K/uL   RBC 2.97 (*) 3.87 - 5.11 MIL/uL   Hemoglobin 7.9 (*) 12.0 - 15.0 g/dL   HCT 24.0 (*) 36.0 - 46.0 %   MCV 80.8  78.0 - 100.0 fL   MCH 26.6  26.0 - 34.0 pg   MCHC 32.9  30.0 - 36.0 g/dL   RDW 15.1  11.5 - 15.5 %   Platelets 266  150 - 400 K/uL   Neutrophils Relative % 66  43 - 77 %   Neutro Abs 8.5 (*) 1.7 - 7.7 K/uL   Lymphocytes Relative 24  12 - 46 %   Lymphs Abs 3.0  0.7 - 4.0 K/uL   Monocytes Relative 8  3 - 12 %   Monocytes Absolute 1.1 (*) 0.1 - 1.0 K/uL   Eosinophils Relative 2  0 - 5 %   Eosinophils Absolute 0.2  0.0 - 0.7 K/uL   Basophils Relative 0  0 - 1 %   Basophils Absolute 0.0  0.0 - 0.1 K/uL  TYPE AND SCREEN     Status: None   Collection Time    12/27/13  5:35 PM      Result Value Ref Range   ABO/RH(D) O POS     Antibody Screen PENDING     Sample Expiration 29/51/8841    BASIC METABOLIC PANEL     Status: Abnormal   Collection Time    12/27/13  6:00 PM      Result Value Ref Range   Sodium 139  137 - 147 mEq/L   Potassium 3.5 (*) 3.7 - 5.3 mEq/L   Chloride 105  96 - 112 mEq/L   CO2 23  19 - 32 mEq/L   Glucose, Bld 109 (*) 70 - 99 mg/dL   BUN 8  6 - 23 mg/dL   Creatinine, Ser 0.94  0.50 - 1.10 mg/dL   Calcium 8.2 (*) 8.4 - 10.5 mg/dL   GFR calc non Af Amer 81 (*) >90 mL/min   GFR calc Af Amer >90  >90 mL/min   Anion gap 11  5 - 15   After 1,000 ccs of NSS the patient is feeling better, she has no chest pain and no shortness of breath. Re examined and she has no Neuro deficits.   MDM EKG: sinus tachycardia otherwise normal EKG  I discussed clinical findings, lab and EKG results with Dr. Nelda Marseille. Will continue to hydrate patient with second bag of Normal saline and see how patient responds. Dr. Nelda Marseille goes off call at 1900 and CC/OB will take over her call.  2135: Patient reports that she is feeling much better after the IV fluids. Discussed blood transfusion, and patient still refuses to have a blood transfusion at this time. She  states that her only problem now is that her pain has  returned as she has not taken any pain medication since 1500 today. She would like to go home once her IV fluids are done, and FU with the office as planned for Monday.   Assessment and Plan  Care turned over to Marcille Buffy, CNM  1. Anemia due to other cause    Return precautions reviewed Return to MAU as needed Continue Fe supplementation  Follow-up Information   Follow up with Janyth Pupa, M, DO. (As scheduled)    Specialty:  Obstetrics and Gynecology   Contact information:   [address] [place] [id number] [phone]          NEESE,HOPE 12/27/2013, 6:48 PM

## 2014-02-01 ENCOUNTER — Other Ambulatory Visit: Payer: Self-pay | Admitting: Obstetrics and Gynecology

## 2014-02-01 DIAGNOSIS — R1084 Generalized abdominal pain: Secondary | ICD-10-CM

## 2014-02-07 ENCOUNTER — Ambulatory Visit
Admission: RE | Admit: 2014-02-07 | Discharge: 2014-02-07 | Disposition: A | Discharge status: Home / Self Care | Payer: 59 | Source: Ambulatory Visit | Attending: Obstetrics and Gynecology | Admitting: Obstetrics and Gynecology

## 2014-02-07 ENCOUNTER — Other Ambulatory Visit: Payer: Self-pay | Admitting: Obstetrics and Gynecology

## 2014-02-07 DIAGNOSIS — R1084 Generalized abdominal pain: Secondary | ICD-10-CM

## 2014-02-20 ENCOUNTER — Ambulatory Visit
Admission: RE | Admit: 2014-02-20 | Discharge: 2014-02-20 | Disposition: A | Discharge status: Home / Self Care | Payer: 59 | Source: Ambulatory Visit | Attending: Family Medicine | Admitting: Family Medicine

## 2014-02-20 ENCOUNTER — Other Ambulatory Visit: Payer: Self-pay | Admitting: Family Medicine

## 2014-02-20 DIAGNOSIS — J181 Lobar pneumonia, unspecified organism: Secondary | ICD-10-CM

## 2014-06-17 ENCOUNTER — Other Ambulatory Visit (INDEPENDENT_AMBULATORY_CARE_PROVIDER_SITE_OTHER): Payer: Self-pay

## 2015-09-25 IMAGING — CT CT ABD-PELV W/O CM
3 of 4 series · 12 of 36 positions shown, 18 images · IV contrast (READICAT/WATER)
Comparison: None.

CLINICAL DATA: Right lower quadrant abdominal pain

EXAM:
CT ABDOMEN AND PELVIS WITHOUT CONTRAST
TECHNIQUE: Multidetector CT imaging of the abdomen and pelvis was performed
following the standard protocol without IV contrast.

[Series 3: abd/pelvis w/o · axial · non-contrast · 0.76mm/px · z∈[-300,-16]mm · 7 of 75 slices shown, 12 images]
[im 10/75  soft-tissue]
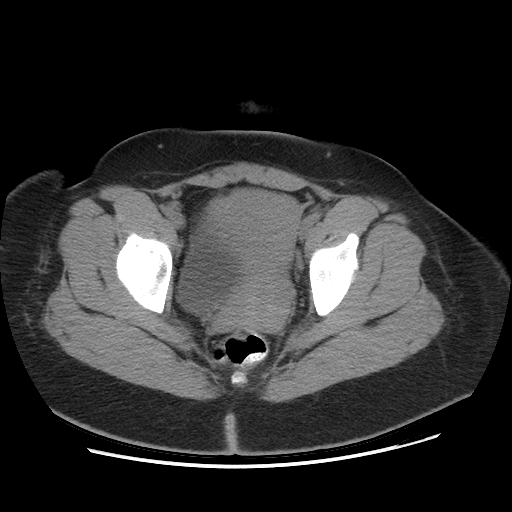
[im 10/75  bone]
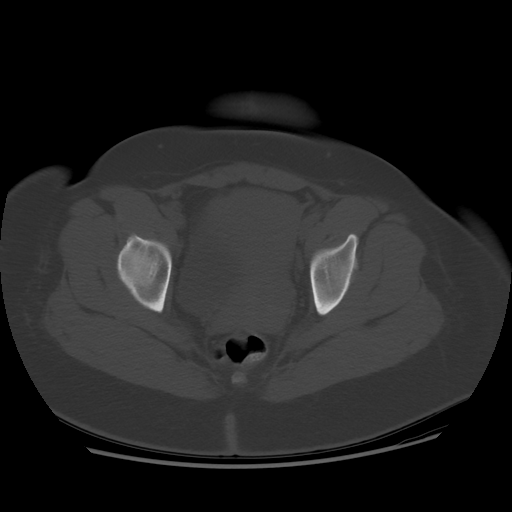
[im 19/75  soft-tissue]
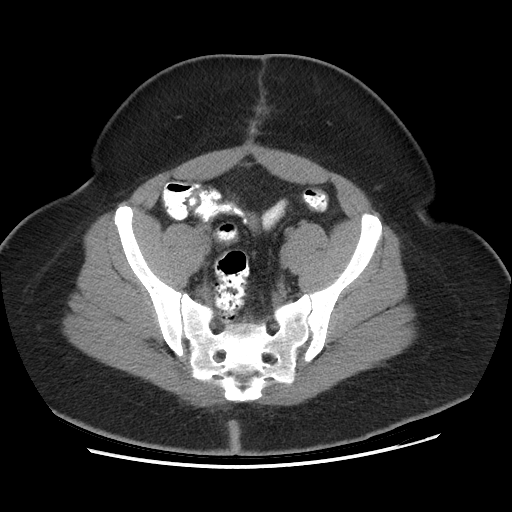
[im 28/75  soft-tissue]
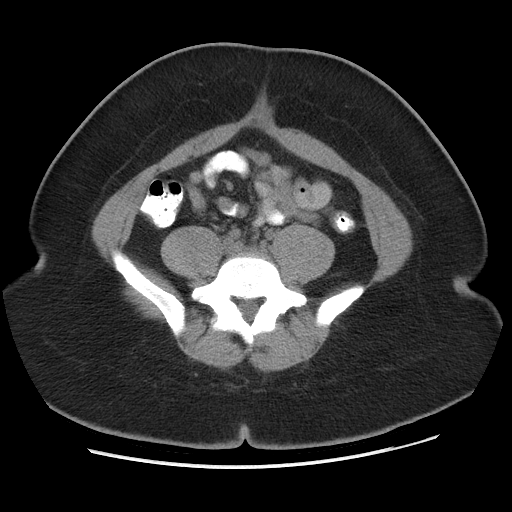
[im 38/75  soft-tissue]
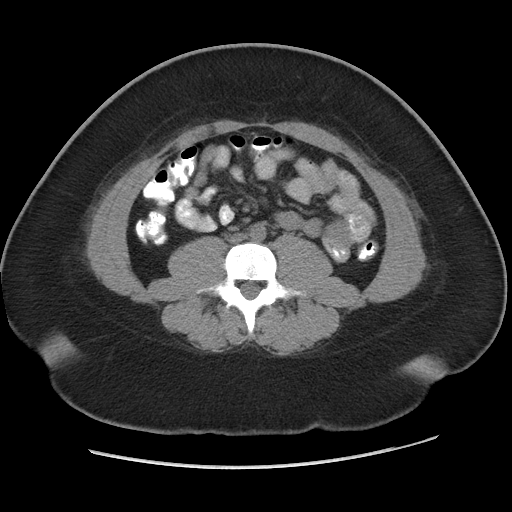
[im 38/75  lung]
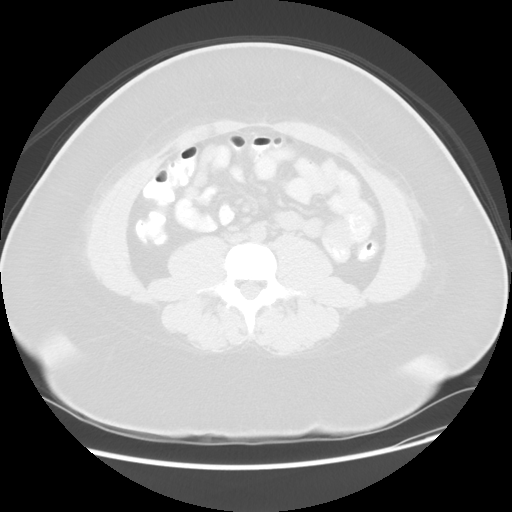
[im 47/75  soft-tissue]
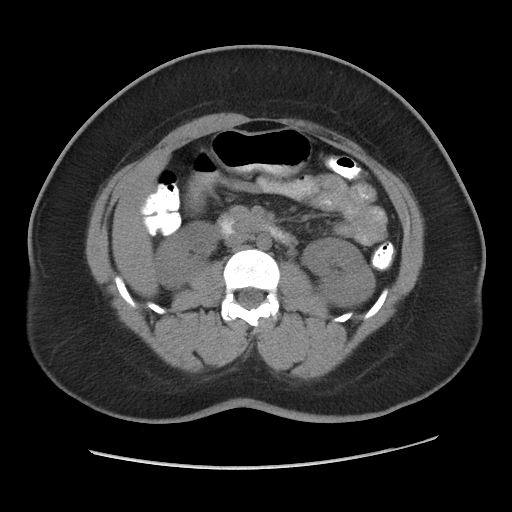
[im 47/75  lung]
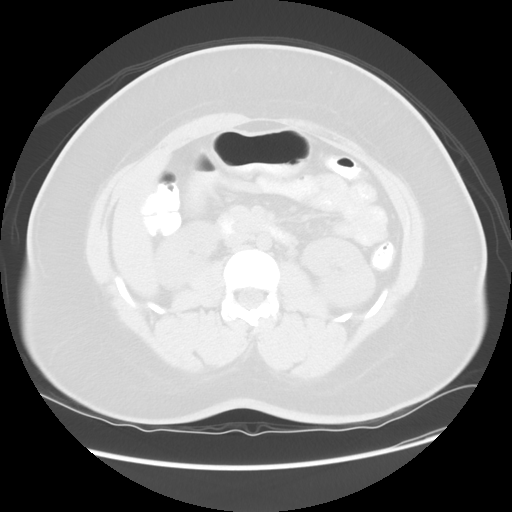
[im 56/75  soft-tissue]
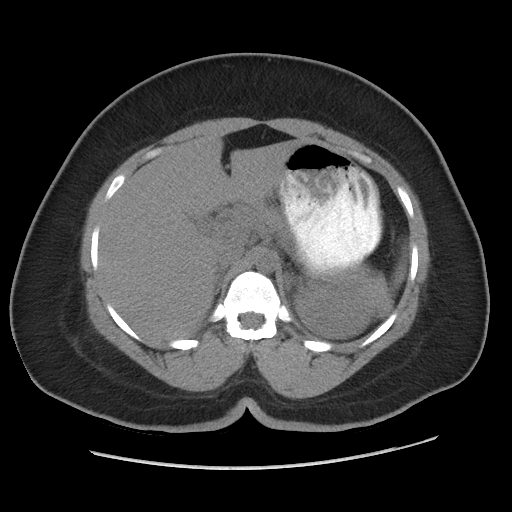
[im 56/75  lung]
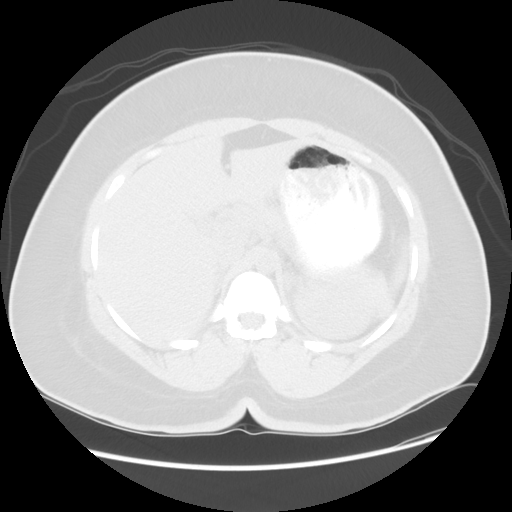
[im 65/75  soft-tissue]
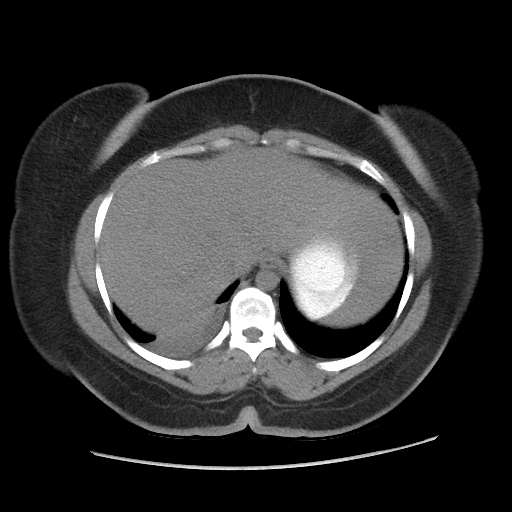
[im 65/75  lung]
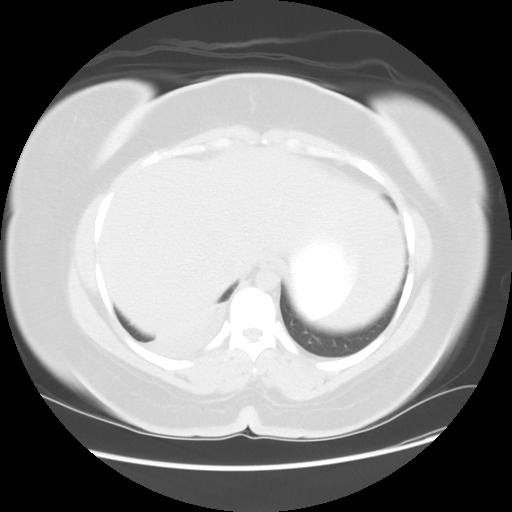

[Series 601: coronal body · coronal · 0.85mm/px · 1 of 128 slices shown, 2 images]
[im 43/128  soft-tissue]
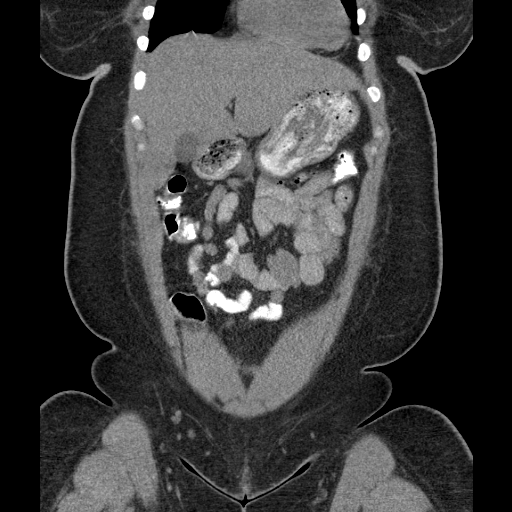
[im 43/128  bone]
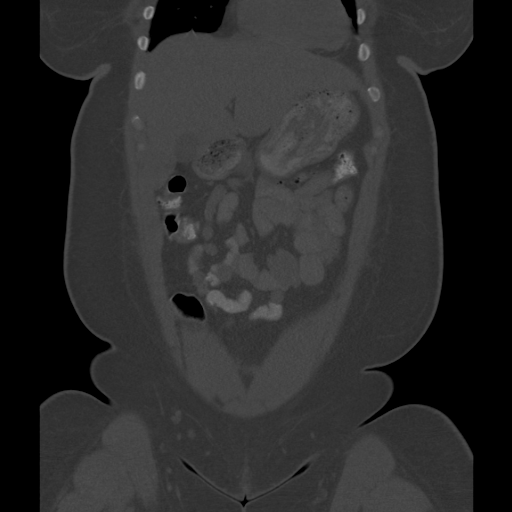

[Series 602: sagittal body · sagittal · 0.85mm/px · 4 of 156 slices shown]
[im 17/156  soft-tissue]
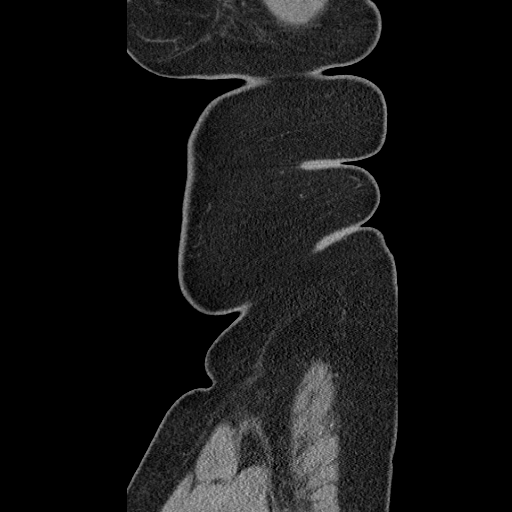
[im 33/156  soft-tissue]
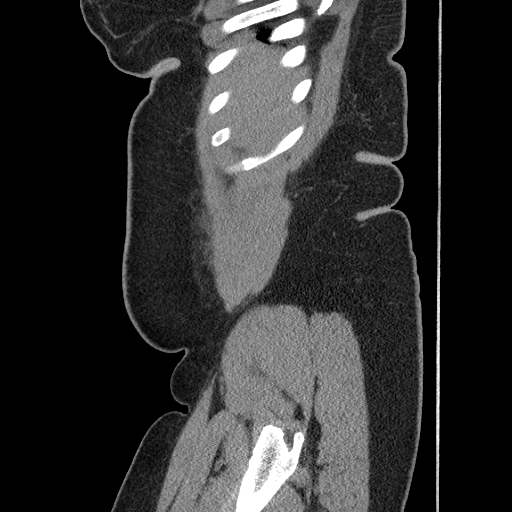
[im 49/156  soft-tissue]
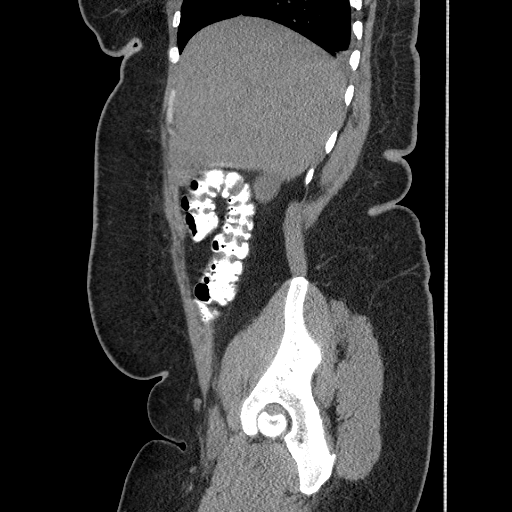
[im 66/156  soft-tissue]
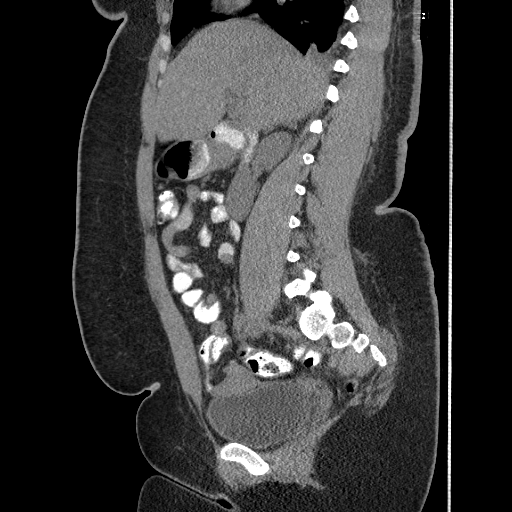

[12 of 36 positions shown; findings below may reference images not displayed]

FINDINGS: Lower chest: Small area of potential loculated right pleural
effusion and overlying consolidation is identified at the right
base, image 11/series 3.

Hepatobiliary: No suspicious liver abnormality identified. The
gallbladder appears normal. There is no biliary dilatation
identified.

Pancreas: The pancreas is on unremarkable.

Spleen: Normal appearance of the spleen.

Adrenals/Urinary Tract: The adrenal glands are both normal.Normal
appearance of the kidneys. No hydronephrosis or hydroureter. No
kidney stones identified. The urinary bladder appears normal.

Stomach/Bowel: The stomach appears normal. The small bowel loops
have a normal course and caliber and there is no evidence for a
bowel obstruction. The appendix is visualized and appears normal.
Normal appearance of the colon.

Vascular/Lymphatic: Normal caliber of the abdominal aorta. There is
no retroperitoneal adenopathy identified. No mesenteric adenopathy
identified. No pelvic or inguinal adenopathy noted.

Reproductive: The uterus and the adnexal structures are on
unremarkable.

Other: No free fluid or fluid collections identified within the
abdomen or pelvis. Below the level of the emboli kiss the ventral
abdominal wall incision site is identified. There is no evidence for
incisional hernia or fluid collection.

Musculoskeletal: Review of the visualized osseous structures is on
unremarkable.
IMPRESSION: 1. No acute findings within the abdomen or the pelvis.
2. Infraumbilical ventral abdominal wall incision without
complications.
3. The appendix is visualized and appears normal.
4. Subpleural consolidation is identified within the posterior a
medial right lung base. Possible small loculated effusion is also
noted. This is of uncertain clinical significance.

## 2016-12-22 ENCOUNTER — Other Ambulatory Visit: Payer: Self-pay | Admitting: Gastroenterology

## 2016-12-22 DIAGNOSIS — R1030 Lower abdominal pain, unspecified: Secondary | ICD-10-CM

## 2016-12-29 ENCOUNTER — Other Ambulatory Visit: Payer: Self-pay

## 2017-01-03 ENCOUNTER — Ambulatory Visit
Admission: RE | Admit: 2017-01-03 | Discharge: 2017-01-03 | Disposition: A | Discharge status: Home / Self Care | Payer: 59 | Source: Ambulatory Visit | Attending: Gastroenterology | Admitting: Gastroenterology

## 2017-01-03 DIAGNOSIS — R1030 Lower abdominal pain, unspecified: Secondary | ICD-10-CM

## 2017-01-05 ENCOUNTER — Other Ambulatory Visit: Payer: Self-pay | Admitting: Gastroenterology

## 2017-01-05 DIAGNOSIS — D1803 Hemangioma of intra-abdominal structures: Secondary | ICD-10-CM

## 2017-01-11 ENCOUNTER — Emergency Department (HOSPITAL_COMMUNITY)
Admission: EM | Admit: 2017-01-11 | Discharge: 2017-01-11 | Disposition: A | Discharge status: Home / Self Care | Payer: 59 | Attending: Emergency Medicine | Admitting: Emergency Medicine

## 2017-01-11 ENCOUNTER — Encounter (HOSPITAL_COMMUNITY): Payer: Self-pay

## 2017-01-11 DIAGNOSIS — D219 Benign neoplasm of connective and other soft tissue, unspecified: Secondary | ICD-10-CM | POA: Diagnosis not present

## 2017-01-11 DIAGNOSIS — R102 Pelvic and perineal pain: Secondary | ICD-10-CM | POA: Diagnosis not present

## 2017-01-11 DIAGNOSIS — Z79899 Other long term (current) drug therapy: Secondary | ICD-10-CM | POA: Diagnosis not present

## 2017-01-11 DIAGNOSIS — R1030 Lower abdominal pain, unspecified: Secondary | ICD-10-CM | POA: Diagnosis present

## 2017-01-11 LAB — URINALYSIS, ROUTINE W REFLEX MICROSCOPIC
BILIRUBIN URINE: NEGATIVE
Glucose, UA: NEGATIVE mg/dL
KETONES UR: 5 mg/dL — AB
Nitrite: NEGATIVE
PROTEIN: NEGATIVE mg/dL
Specific Gravity, Urine: 1.02 (ref 1.005–1.030)
pH: 5 (ref 5.0–8.0)

## 2017-01-11 LAB — WET PREP, GENITAL
Clue Cells Wet Prep HPF POC: NONE SEEN
SPERM: NONE SEEN
Trich, Wet Prep: NONE SEEN
Yeast Wet Prep HPF POC: NONE SEEN

## 2017-01-11 LAB — POC URINE PREG, ED: Preg Test, Ur: NEGATIVE

## 2017-01-11 MED ORDER — HYDROCODONE-ACETAMINOPHEN 5-325 MG PO TABS
1.0000 | ORAL_TABLET | Freq: Once | ORAL | Status: AC
Start: 2017-01-11 — End: 2017-01-11
  Administered 2017-01-11: 1 via ORAL
  Filled 2017-01-11: qty 1

## 2017-01-11 MED ORDER — KETOROLAC TROMETHAMINE 60 MG/2ML IM SOLN
30.0000 mg | Freq: Once | INTRAMUSCULAR | Status: AC
  Administered 2017-01-11: 30 mg via INTRAMUSCULAR
  Filled 2017-01-11: qty 2

## 2017-01-11 MED ORDER — TRAMADOL HCL 50 MG PO TABS: 50.0000 mg | ORAL_TABLET | Freq: Four times a day (QID) | ORAL | 0 refills | Status: DC | PRN

## 2017-01-11 NOTE — Discharge Instructions (Signed)
Take ibuprofen or tylenol for pain. Tramadol for severe pain. Please follow up with your OB/GYN for further evaluation.

## 2017-01-11 NOTE — ED Provider Notes (Signed)
[facility] EMERGENCY DEPARTMENT Provider Note   CSN: [id number] Arrival date & time: 01/11/17  1250     History   Chief Complaint Chief Complaint  Patient presents with  . Abdominal Pain  . Pelvic Pain    HPI Rebecca Allison is a 31 y.o. female.  HPI Rebecca Allison is a 31 y.o. femalepresents to emergency department complaining of abdominal pain. Patient states she has had pain for a year and her lower abdomen. She states that the pain got worse in the last few months. She was seen by her OB/GYN 2 months ago and had an ultrasound done which showed fibroids. Patient is currently on birth control that she takes nonstop to prevent her from having periods. She reports history of fibroids and has had fibroid removal in the past. Patient also has been seen by a gastroenterologist and had a CT scan performed just a few weeks ago. CT scan showed possible hemangioma on the liver, fat-containing small umbilical hernia, and fibroids. Patient is scheduled for MRI next week. Patient denies any nausea or vomiting. She reports normal bowel movements. No difficulty urinating. She is having vaginal discharge but no bleeding. She has follow-up with OB/GYN in one month. She is taking ibuprofen for pain which is not helping. States pain is sharp, does not radiate. She states she is here because she is unable to handle the pain and wants to know what's causing it.  Past Medical History:  Diagnosis Date  . Shortness of breath    when climbing over 3 flts stairs    Patient Active Problem List   Diagnosis Date Noted  . Fibroids 12/24/2013  . HYPERCHOLESTEROLEMIA 05/26/2006    Past Surgical History:  Procedure Laterality Date  . MYOMECTOMY N/A 12/24/2013   Procedure: MYOMECTOMY;  Surgeon: Thurnell Lose, MD;  Location: [facility];  Service: Gynecology;  Laterality: N/A;  . NO PAST SURGERIES      OB History    No data available       Home Medications    Prior to Admission  medications   Medication Sig Start Date End Date Taking? Authorizing Provider  ibuprofen (ADVIL,MOTRIN) 600 MG tablet Take 1 tablet (600 mg total) by mouth every 6 (six) hours as needed (mild pain). 12/26/13   Thurnell Lose, MD  iron polysaccharides (NIFEREX) 150 MG capsule Take 1 capsule (150 mg total) by mouth 3 (three) times daily. 12/26/13   Thurnell Lose, MD  Norethindrone-Ethinyl Estradiol Triphasic (TRI-NORINYL, 28,) 0.5/1/0.5-35 MG-MCG tablet Take 1 tablet by mouth daily.    [provider]  oxyCODONE-acetaminophen (PERCOCET/ROXICET) 5-325 MG per tablet Take 1-2 tablets by mouth every 4 (four) hours as needed (moderate to severe pain (when tolerating fluids)). 12/26/13   Thurnell Lose, MD  valACYclovir (VALTREX) 500 MG tablet Take 500 mg by mouth daily as needed (for outbreak.).     [provider]    Family History No family history on file.  Social History Social History  Substance Use Topics  . Smoking status: Never Smoker  . Smokeless tobacco: Never Used  . Alcohol use No     Allergies   Metronidazole   Review of Systems Review of Systems  Constitutional: Negative for chills and fever.  Respiratory: Negative for cough, chest tightness and shortness of breath.   Cardiovascular: Negative for chest pain, palpitations and leg swelling.  Gastrointestinal: Positive for abdominal pain. Negative for diarrhea, nausea and vomiting.  Genitourinary: Positive for vaginal discharge. Negative for dysuria, flank  pain, pelvic pain, vaginal bleeding and vaginal pain.  Musculoskeletal: Negative for arthralgias, myalgias, neck pain and neck stiffness.  Skin: Negative for rash.  Neurological: Negative for dizziness, weakness and headaches.  All other systems reviewed and are negative.    Physical Exam Updated Vital Signs BP (!) 164/78 (BP Location: Right Arm)   Pulse 91   Temp 98.2 F (36.8 C) (Oral)   Resp 18   Ht 5\' 1"  (1.549 m)   Wt 103.4 kg (228 lb)    SpO2 98%   BMI 43.08 kg/m   Physical Exam  Constitutional: She appears well-developed and well-nourished. No distress.  HENT:  Head: Normocephalic.  Eyes: Conjunctivae are normal.  Neck: Neck supple.  Cardiovascular: Normal rate, regular rhythm and normal heart sounds.   Pulmonary/Chest: Effort normal and breath sounds normal. No respiratory distress. She has no wheezes. She has no rales.  Abdominal: Soft. Bowel sounds are normal. She exhibits no distension. There is tenderness. There is no rebound.  Diffuse tenderness, worse in lower abdomen  Genitourinary:  Genitourinary Comments: Normal external genitalia. Normal vaginal canal. Small thin white discharge. Cervix is friablel, closed. No CMT. Uterine and adnexal tenderness present. No masses palpated.    Musculoskeletal: She exhibits no edema.  Neurological: She is alert.  Skin: Skin is warm and dry.  Psychiatric: She has a normal mood and affect. Her behavior is normal.  Nursing note and vitals reviewed.    ED Treatments / Results  Labs (all labs ordered are listed, but only abnormal results are displayed) Labs Reviewed  URINALYSIS, ROUTINE W REFLEX MICROSCOPIC - Abnormal; Notable for the following:       Result Value   Hgb urine dipstick SMALL (*)    Ketones, ur 5 (*)    Leukocytes, UA LARGE (*)    Bacteria, UA RARE (*)    Squamous Epithelial / LPF 0-5 (*)    All other components within normal limits  WET PREP, GENITAL  POC URINE PREG, ED  GC/CHLAMYDIA PROBE AMP ([place]) NOT AT [facility]    EKG  EKG Interpretation None       Radiology No results found.  Procedures Procedures (including critical care time)  Medications Ordered in ED Medications  ketorolac (TORADOL) injection 30 mg (not administered)  HYDROcodone-acetaminophen (NORCO/VICODIN) 5-325 MG per tablet 1 tablet (not administered)     Initial Impression / Assessment and Plan / ED Course  I have reviewed the triage vital signs and the nursing  notes.  Pertinent labs & imaging results that were available during my care of the patient were reviewed by me and considered in my medical decision making (see chart for details).     Patient with lower abdominal pain, symptoms for over a year, worse in the last few months. Has already seen OB/GYN, had negative ultrasound of the fibroids, also has been seen by her GI doctor who performed CT scan which did not show any explanation for patient's symptoms. MRI pending next week. Patient is pretty adamant about figuring out what the cause of her pain is. I discussed at length with patient that we may not be able to find the answer today since she is has already had all of the imaging done as an outpatient that is available here. I did not think she needs emergent MRI which she is requesting. She states she also has had blood work done in the last few weeks which was normal. We discussed that the only finding on IMAGING that could  potentially explain her pain is her fibroids. I will perform pelvic exam today to make sure she does not have any pelvic infection, will try to manage pain.  3:41 PM Pelvic exam unremarkable other than friable cervix. Patient states she has not had intercourse in several years and absolutely no way she could have STI. Will send cultures. Will have her follow up with OB/GYN. I do not think she needs any further imaging today on emergent basis.   Vitals:   01/11/17 1259  BP: (!) 164/78  Pulse: 91  Resp: 18  Temp: 98.2 F (36.8 C)  TempSrc: Oral  SpO2: 98%  Weight: 103.4 kg (228 lb)  Height: 5\' 1"  (1.549 m)     Final Clinical Impressions(s) / ED Diagnoses   Final diagnoses:  Pelvic pain in female  Fibroids    New Prescriptions New Prescriptions   TRAMADOL (ULTRAM) 50 MG TABLET    Take 1 tablet (50 mg total) by mouth every 6 (six) hours as needed.     Jeannett Senior, PA-C 01/11/17 1544    Tanna Furry, MD 01/16/17 [phone]

## 2017-01-11 NOTE — ED Triage Notes (Signed)
Patient complains of ongoing lower abdominal and pelvic pain for months. Has seen GI for same and had CT that showed hernia. Patient also reports fibroids and thinks may be related, NAD

## 2017-01-12 LAB — GC/CHLAMYDIA PROBE AMP (~~LOC~~) NOT AT ARMC
Chlamydia: NEGATIVE
Neisseria Gonorrhea: NEGATIVE

## 2017-01-12 LAB — URINE CULTURE: Culture: <10000 — AB

## 2017-01-20 ENCOUNTER — Ambulatory Visit
Admission: RE | Admit: 2017-01-20 | Discharge: 2017-01-20 | Disposition: A | Discharge status: Home / Self Care | Payer: 59 | Source: Ambulatory Visit | Attending: Gastroenterology | Admitting: Gastroenterology

## 2017-01-20 DIAGNOSIS — D1803 Hemangioma of intra-abdominal structures: Secondary | ICD-10-CM

## 2017-01-20 MED ORDER — GADOBENATE DIMEGLUMINE 529 MG/ML IV SOLN
20.0000 mL | Freq: Once | INTRAVENOUS | Status: AC | PRN
  Administered 2017-01-20: 20 mL via INTRAVENOUS

## 2017-02-14 ENCOUNTER — Other Ambulatory Visit (HOSPITAL_COMMUNITY)
Admission: RE | Admit: 2017-02-14 | Discharge: 2017-02-14 | Disposition: A | Discharge status: Home / Self Care | Payer: 59 | Source: Ambulatory Visit | Attending: Obstetrics and Gynecology | Admitting: Obstetrics and Gynecology

## 2017-02-14 ENCOUNTER — Other Ambulatory Visit: Payer: Self-pay | Admitting: Obstetrics and Gynecology

## 2017-02-14 DIAGNOSIS — Z124 Encounter for screening for malignant neoplasm of cervix: Secondary | ICD-10-CM | POA: Insufficient documentation

## 2017-02-16 LAB — CYTOLOGY - PAP
Chlamydia: NEGATIVE
DIAGNOSIS: NEGATIVE
Neisseria Gonorrhea: NEGATIVE

## 2017-07-04 DIAGNOSIS — R102 Pelvic and perineal pain: Secondary | ICD-10-CM | POA: Diagnosis not present

## 2017-07-04 DIAGNOSIS — D251 Intramural leiomyoma of uterus: Secondary | ICD-10-CM | POA: Diagnosis not present

## 2017-07-04 DIAGNOSIS — L7682 Other postprocedural complications of skin and subcutaneous tissue: Secondary | ICD-10-CM | POA: Diagnosis not present

## 2017-07-04 DIAGNOSIS — N9489 Other specified conditions associated with female genital organs and menstrual cycle: Secondary | ICD-10-CM | POA: Diagnosis not present

## 2017-07-13 ENCOUNTER — Other Ambulatory Visit: Payer: Self-pay

## 2017-07-13 ENCOUNTER — Encounter: Payer: Self-pay | Admitting: Physical Therapy

## 2017-07-13 ENCOUNTER — Ambulatory Visit: Payer: BLUE CROSS/BLUE SHIELD | Attending: Obstetrics & Gynecology | Admitting: Physical Therapy

## 2017-07-13 DIAGNOSIS — R252 Cramp and spasm: Secondary | ICD-10-CM

## 2017-07-13 DIAGNOSIS — R278 Other lack of coordination: Secondary | ICD-10-CM | POA: Diagnosis not present

## 2017-07-13 DIAGNOSIS — M6281 Muscle weakness (generalized): Secondary | ICD-10-CM | POA: Diagnosis not present

## 2017-07-13 NOTE — Therapy (Signed)
[facility] [address], [place], [place], [address] Phone: [phone]   Fax:  [phone]  Physical Therapy Evaluation  Patient Details  Name: Rebecca Allison MRN: [id number] Date of Birth: 1985-09-25 Referring Provider: Dr. Iona Coach Dasouki Abu-Alnadi    Encounter Date: 07/13/2017  PT End of Session - 07/13/17 1651    Visit Number  1    Date for PT Re-Evaluation  11/12/17    PT Start Time  1605    PT Stop Time  1651    PT Time Calculation (min)  46 min    Activity Tolerance  Patient tolerated treatment well    Behavior During Therapy  [facility] for tasks assessed/performed       Past Medical History:  Diagnosis Date  . Shortness of breath    when climbing over 3 flts stairs    Past Surgical History:  Procedure Laterality Date  . MYOMECTOMY N/A 12/24/2013   Procedure: MYOMECTOMY;  Surgeon: Thurnell Lose, MD;  Location: [facility];  Service: Gynecology;  Laterality: N/A;  . NO PAST SURGERIES      There were no vitals filed for this visit.   Subjective Assessment - 07/13/17 1610    Subjective  Patient reports since her surgery on 12/24/2013 she started to have increased pain last year.  Her abdominal wall started to hurt and radiate to the left side. some fibroids grew back and urterus may be pressed against the abdominal wall.     Patient Stated Goals  reduce pain    Currently in Pain?  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 Iona Coach Dasouki Abu-Alnadi     Onset Date/Surgical Date  12/24/13    Prior Therapy  None      Precautions   Precautions  None      Restrictions   Weight Bearing Restrictions  No      Balance Screen   Has the patient fallen in the past 6 months  No    Has the patient had a decrease in activity level because of a fear of falling?   No    Is the patient reluctant to leave their home because of a fear of falling?   No      Home Film/video editor residence      Prior Function   Level of Independence  Independent    Vocation  Full time employment    Vocation Requirements  sitting      Cognition   Overall Cognitive Status  Within Functional Limits for tasks assessed      Posture/Postural Control   Posture/Postural Control  No significant limitations      ROM / Strength   AROM / PROM / Strength  AROM;PROM;Strength  AROM   Lumbar Extension  decreased by 25%    Lumbar - Right Side Bend  decreased by 25%      Strength   Right Hip Extension  4/5    Right Hip ADduction  4/5    Left Hip ABduction  4/5      Palpation   Spinal mobility  decreased lower rib cage mobiltiy    SI assessment   left ilium rotated posteriorly;     Palpation comment  tenderness located in lower abdomen; decreased scar mobility, diaphgram                Objective measurements completed on examination: See above findings.    Pelvic Floor Special Questions - 07/13/17 0001    Prior Pregnancies  No    Currently Sexually Active  No not painful in the past    Urinary Leakage  Yes    Activities that cause leaking  With strong urge;Laughing;Coughing    Urinary urgency  Yes    Urinary frequency  1.5 to 2 hours for urination    Fecal incontinence  No    Skin Integrity  Intact    Pelvic Floor Internal Exam  Patient confirms identification and approves PT to assess muscle strength and treat    Exam Type  Rectal    Palpation  tenderness located  in bil. obturator internist and levator ane, decreased mobility of bil. urethra sphincter    Strength  fair squeeze, definite lift more posterior than anterior    Strength # of reps  3    Strength # of seconds  3    Tone  increased               PT Education - 07/13/17 1650    Education provided  Yes    Education Details  information on vaginal massager; scar massage; abdominal massage    Person(s) Educated  Patient    Methods  Explanation;Demonstration;Verbal cues;Handout    Comprehension  Returned demonstration;Verbalized understanding       PT Short Term Goals - 07/13/17 1658      PT SHORT TERM GOAL #1   Title  independent with initial HEP    Time  4    Period  Weeks    Status  New    Target Date  08/10/17      PT SHORT TERM GOAL #2   Title  pain with bending over decreased >/= 25% due to decrease tenderness and fascial restrictions    Time  4    Period  Weeks    Status  New    Target Date  08/10/17      PT SHORT TERM GOAL #3   Title  urinary leakage decreased >/= 25% due to improved mobility of pelvic floor so she is able to contract correctly    Time  4    Period  Weeks    Status  New    Target Date  08/10/17      PT SHORT TERM GOAL #4   Title  independent with abdominal massage to improve tissue mobitlity    Time  4    Period  Weeks    Status  New    Target Date  08/10/17        PT Long Term Goals - 07/13/17 1700      PT LONG TERM GOAL #1   Title  independent with HEP and understands how to progress herself    Time  4    Period  Months    Status  New    Target Date  11/12/17      PT LONG TERM GOAL #2   Title  able to use pain management techniques including internal soft tissue work independently    Time  4    Period  Months    Status  New    Target Date  11/12/17      PT LONG TERM GOAL #3   Title  ability to bend over with pain decreased >/= 75% due to improve tissue mobility of the abdominal and scar    Time  4    Period  Months     Status  New    Target Date  11/12/17      PT LONG TERM GOAL #4   Title  urinary leakage decreased >/= 75% due to improved tissue  mobility to perform a full circular contraction and lift    Time  4    Period  Months    Status  New    Target Date  08/10/17      PT LONG TERM GOAL #5   Title  abilty to sit and  get up quickly with pain decreased >/= 75% due to improved overall strength and mobility    Time  4    Period  Months    Status  New    Target Date  11/12/17             Plan - 07/13/17 1651    Clinical Impression Statement  Patient is a 32 year old female with lower abdominal pain and urinary leakage since her Myomectomy surgery on 12/24/2017. Patient reports her pain is intermittently at level 9/10 with bending over, sitting too long, and getting up too fast.  Patient has decreased mobility of lower rib cage and lumbar ROM for extension and right sidebending.  Abdominal strength is 1/5 and hip strength is 4/5. Lower abdominal scar has decreased mobilty and is tender.  Patient has tenderness located on diaphgram, lower abdomen, bilateral obturator internist and levator ani.  Pelvic floor strength is 3/5 with decreased movement of the anterior introitus.  Tightness felt in bilateral urethra sphincter.  Pelvic floor muscles have increased tone.  Patient has tight hamstring, hip flexors and rotators.  Patient will leak urine with strong urge sneeze cough.  Patient will benefit from skilled therapy to reduce her pain and improve strength and flexibility to restore function.     History and Personal Factors relevant to plan of care:  Myomectomy 12/24/2013    Clinical Presentation  Stable    Clinical Presentation due to:  stable condition    Clinical Decision Making  Low    Rehab Potential  Excellent    Clinical Impairments Affecting Rehab Potential  Myomectomy 12/24/2013    PT Frequency  1x / week    PT Duration  Other (comment) 4 months    PT Treatment/Interventions   Biofeedback;Cryotherapy;Electrical Stimulation;Moist Heat;Ultrasound;Therapeutic exercise;Therapeutic activities;Neuromuscular re-education;Patient/family education;Passive range of motion;Scar mobilization;Manual techniques;Dry needling    PT Next Visit Plan  scar massage, correct pelvis, flexibility exercises, diaphragmatic breathing, internal work    PT Home Exercise Plan  flexibility exercises    Consulted and Agree with Plan of Care  Patient       Patient will benefit from skilled therapeutic intervention in order to improve the following deficits and impairments:  Increased fascial restricitons, Pain, Decreased mobility, Decreased scar mobility, Increased muscle spasms, Impaired  tone, Decreased activity tolerance, Decreased endurance, Decreased strength, Decreased range of motion, Impaired flexibility  Visit Diagnosis: Muscle weakness (generalized) - Plan: PT plan of care cert/re-cert  Cramp and spasm - Plan: PT plan of care cert/re-cert  Other lack of coordination - Plan: PT plan of care cert/re-cert     Problem List Patient Active Problem List   Diagnosis Date Noted  . Fibroids 12/24/2013  . HYPERCHOLESTEROLEMIA 05/26/2006    Earlie Counts, PT 07/13/17 5:06 PM   [facility] [address] [place], [place], [address] Phone: [phone]   Fax:  [phone]  Name: Rebecca Allison MRN: [id number] Date of Birth: 1985/09/03

## 2017-07-13 NOTE — Patient Instructions (Addendum)
About Abdominal Massage  Abdominal massage, also called external colon massage, is a self-treatment circular massage technique that can reduce and eliminate gas and ease constipation. The colon naturally contracts in waves in a clockwise direction starting from inside the right hip, moving up toward the ribs, across the belly, and down inside the left hip.  When you perform circular abdominal massage, you help stimulate your colon's normal wave pattern of movement called peristalsis.  It is most beneficial when done after eating.  Positioning You can practice abdominal massage with oil while lying down, or in the shower with soap.  Some people find that it is just as effective to do the massage through clothing while sitting or standing.  How to Massage Start by placing your finger tips or knuckles on your right side, just inside your hip bone.  . Make small circular movements while you move upward toward your rib cage.   . Once you reach the bottom right side of your rib cage, take your circular movements across to the left side of the bottom of your rib cage.  . Next, move downward until you reach the inside of your left hip bone.  This is the path your feces travel in your colon. . Continue to perform your abdominal massage in this pattern for 10 minutes each day.     You can apply as much pressure as is comfortable in your massage.  Start gently and build pressure as you continue to practice.  Notice any areas of pain as you massage; areas of slight pain may be relieved as you massage, but if you have areas of significant or intense pain, consult with your healthcare provider.  Other Considerations . General physical activity including bending and stretching can have a beneficial massage-like effect on the colon.  Deep breathing can also stimulate the colon because breathing deeply activates the same nervous system that supplies the colon.   . Abdominal massage should always be used in  combination with a bowel-conscious diet that is high in the proper type of fiber for you, fluids (primarily water), and a regular exercise program.   Walking 15 minutes per day  Scar Massage  Scar massage is done to improve the mobility of scar, decrease scar tissue from building up, reduce adhesions, and prevent Keloids from forming. Start scar massage after scabs have fallen off by themselves and no open areas. The first few weeks after surgery, it is normal for a scar to appear pink or red and slightly raised. Scars can itch or have areas of numbness. Some scars may be sensitive.   Direct Scar massage: after scar is healed, no opening, no scab 1.  Place pads of two fingers together directly on the scar starting at one end of the scar. Move the fingers up and down across the scar holding 5 seconds one direction.  Then go opposite direction hold 5 seconds.  2. Move over to the next section of the scar and repeat.  Work your way along the entire length of the scar.   3. Next make diagonal movements along the scar holding 5 seconds at one direction. 4. Next movement is side to side. 5. Do not rub fingers over the scar.  Instead keep firm pressure and move scar over the tissue it is on top   [facility] [address], [place], [place] [address] Phone # [phone] Fax [phone] [employer] [address] Vickey Sages Intimate Basics Monia Pouch - 2 Piece Vibrating Silicone Dilators  Set With Ergonomic Finger Loop - Waterproof [employer] for

## 2017-07-20 ENCOUNTER — Encounter

## 2017-07-26 ENCOUNTER — Ambulatory Visit: Payer: BLUE CROSS/BLUE SHIELD | Admitting: Physical Therapy

## 2017-07-26 ENCOUNTER — Encounter: Payer: Self-pay | Admitting: Physical Therapy

## 2017-07-26 DIAGNOSIS — R252 Cramp and spasm: Secondary | ICD-10-CM

## 2017-07-26 DIAGNOSIS — R278 Other lack of coordination: Secondary | ICD-10-CM | POA: Diagnosis not present

## 2017-07-26 DIAGNOSIS — M6281 Muscle weakness (generalized): Secondary | ICD-10-CM

## 2017-07-26 NOTE — Therapy (Signed)
[facility] [address], [place], [place], [address] Phone: [phone]   Fax:  [phone]  Physical Therapy Treatment  Patient Details  Name: Rebecca Allison MRN: [id number] Date of Birth: 01-14-1986 Referring Provider: Dr. Olivia Canter Abu-Alnadi    Encounter Date: 07/26/2017  PT End of Session - 07/26/17 1623    Visit Number  2    Date for PT Re-Evaluation  11/12/17    Authorization - Visit Number  2    Authorization - Number of Visits  25    PT Start Time  7062    PT Stop Time  1610    PT Time Calculation (min)  40 min    Activity Tolerance  Patient tolerated treatment well    Behavior During Therapy  [facility] for tasks assessed/performed       Past Medical History:  Diagnosis Date  . Shortness of breath    when climbing over 3 flts stairs    Past Surgical History:  Procedure Laterality Date  . MYOMECTOMY N/A 12/24/2013   Procedure: MYOMECTOMY;  Surgeon: Thurnell Lose, MD;  Location: [facility];  Service: Gynecology;  Laterality: N/A;  . NO PAST SURGERIES      There were no vitals filed for this visit.  Subjective Assessment - 07/26/17 1541    Subjective  I have a cold.  I was hurting a little bit more.  I have started walking. I have gotten the dilator and have not used it due to having a cold.     Patient Stated Goals  reduce pain    Currently in Pain?  Yes    Pain Score  6     Pain Location  Abdomen    Pain Orientation  Left;Lower    Pain Descriptors / Indicators  Dull;Sharp;Aching    Pain Type  Chronic pain    Pain Onset  More than a month ago    Pain Frequency  Intermittent    Aggravating Factors   bending over, sitting too long, getting up too fast    Pain Relieving Factors  heat pad, ibuprofen, flexeril    Multiple Pain Sites  No                       [facility] Adult PT Treatment/Exercise - 07/26/17 0001      Exercises   Exercises  Lumbar      Lumbar Exercises: Aerobic    Stationary Bike  6 minutes on bike      Manual Therapy   Manual Therapy  Soft tissue mobilization;Myofascial release    Soft tissue mobilization  scar massage to lower abdomanil    Myofascial Release  to mid abdominal to release through the fascia and improve tissue mobility             PT Education - 07/26/17 1620    Education provided  Yes    Education Details  Access Code: [place]    Person(s) Educated  Patient    Methods  Explanation;Demonstration;Handout    Comprehension  Verbalized understanding;Returned demonstration       PT Short Term Goals - 07/13/17 1658      PT SHORT TERM GOAL #1   Title  independent with initial HEP    Time  4    Period  Weeks    Status  New    Target Date  08/10/17      PT SHORT TERM GOAL #2  Title  pain with bending over decreased >/= 25% due to decrease tenderness and fascial restrictions    Time  4    Period  Weeks    Status  New    Target Date  08/10/17      PT SHORT TERM GOAL #3   Title  urinary leakage decreased >/= 25% due to improved mobility of pelvic floor so she is able to contract correctly    Time  4    Period  Weeks    Status  New    Target Date  08/10/17      PT SHORT TERM GOAL #4   Title  independent with abdominal massage to improve tissue mobitlity    Time  4    Period  Weeks    Status  New    Target Date  08/10/17        PT Long Term Goals - 07/13/17 1700      PT LONG TERM GOAL #1   Title  independent with HEP and understands how to progress herself    Time  4    Period  Months    Status  New    Target Date  11/12/17      PT LONG TERM GOAL #2   Title  able to use pain management techniques including internal soft tissue work independently    Time  4    Period  Months    Status  New    Target Date  11/12/17      PT LONG TERM GOAL #3   Title  ability to bend over with pain decreased >/= 75% due to improve tissue mobility of the abdominal and scar    Time  4    Period  Months    Status   New    Target Date  11/12/17      PT LONG TERM GOAL #4   Title  urinary leakage decreased >/= 75% due to improved tissue  mobility to perform a full circular contraction and lift    Time  4    Period  Months    Status  New    Target Date  08/10/17      PT LONG TERM GOAL #5   Title  abilty to sit and  get up quickly with pain decreased >/= 75% due to improved overall strength and mobility    Time  4    Period  Months    Status  New    Target Date  11/12/17            Plan - 07/26/17 1543    Clinical Impression Statement  Patient has not met goals yet due to just starting therapy.  Patient has the vaginal dilator but has not been able to use it due to her cold.  Patient has increased scar mobility after manual work.  Patient is very tight and unable to do childs pose due to limitation in hip and knee ROM.  Patient fatiques easily due to being deconditioned.  Patient will benefi tfrom skilled therapy to redcue her pain and improve strength and flexibility to restore function.     Rehab Potential  Excellent    Clinical Impairments Affecting Rehab Potential  Myomectomy 12/24/2013    PT Frequency  1x / week    PT Duration  Other (comment) 4 months    PT Treatment/Interventions  Biofeedback;Cryotherapy;Electrical Stimulation;Moist Heat;Ultrasound;Therapeutic exercise;Therapeutic activities;Neuromuscular re-education;Patient/family education;Passive range of motion;Scar mobilization;Manual techniques;Dry needling  PT Next Visit Plan  scar massage, correct pelvis,, diaphragmatic breathing, internal work    PT Home Exercise Plan  Access Code: [email or web address]    Consulted and Agree with Plan of Care  Patient       Patient will benefit from skilled therapeutic intervention in order to improve the following deficits and impairments:  Increased fascial restricitons, Pain, Decreased mobility, Decreased scar mobility, Increased muscle spasms, Impaired tone, Decreased activity tolerance, Decreased  endurance, Decreased strength, Decreased range of motion, Impaired flexibility  Visit Diagnosis: Muscle weakness (generalized)  Cramp and spasm  Other lack of coordination     Problem List Patient Active Problem List   Diagnosis Date Noted  . Fibroids 12/24/2013  . HYPERCHOLESTEROLEMIA 05/26/2006    Earlie Counts, PT 07/26/17 4:24 PM   [facility] [address] [place], [place], [address] Phone: [phone]   Fax:  [phone]  Name: Rebecca Allison MRN: [id number] Date of Birth: [birth year]

## 2017-08-09 ENCOUNTER — Ambulatory Visit: Payer: BLUE CROSS/BLUE SHIELD | Attending: Obstetrics & Gynecology | Admitting: Physical Therapy

## 2017-08-09 ENCOUNTER — Encounter: Payer: Self-pay | Admitting: Physical Therapy

## 2017-08-09 DIAGNOSIS — M6281 Muscle weakness (generalized): Secondary | ICD-10-CM | POA: Diagnosis not present

## 2017-08-09 DIAGNOSIS — R252 Cramp and spasm: Secondary | ICD-10-CM | POA: Diagnosis not present

## 2017-08-09 DIAGNOSIS — R278 Other lack of coordination: Secondary | ICD-10-CM | POA: Diagnosis not present

## 2017-08-09 NOTE — Therapy (Signed)
[facility] [address] [place], [place], [address] Phone: [phone]   Fax:  [phone]  Physical Therapy Treatment  Patient Details  Name: Rebecca Allison MRN: [id number] Date of Birth: 1985/06/24 Referring Provider: Dr. Iona Coach Dasouki Abu-Alnadi    Encounter Date: 08/09/2017  PT End of Session - 08/09/17 1700    Visit Number  3    Date for PT Re-Evaluation  11/12/17    Authorization Type  BCBS    Authorization - Visit Number  3    Authorization - Number of Visits  60    PT Start Time  5093    PT Stop Time  1700    PT Time Calculation (min)  45 min    Activity Tolerance  Patient tolerated treatment well    Behavior During Therapy  [facility] for tasks assessed/performed       Past Medical History:  Diagnosis Date  . Shortness of breath    when climbing over 3 flts stairs    Past Surgical History:  Procedure Laterality Date  . MYOMECTOMY N/A 12/24/2013   Procedure: MYOMECTOMY;  Surgeon: Thurnell Lose, MD;  Location: [facility];  Service: Gynecology;  Laterality: N/A;  . NO PAST SURGERIES      There were no vitals filed for this visit.  Subjective Assessment - 08/09/17 1612    Subjective  I am getting better from my cold.     Patient Stated Goals  reduce pain    Currently in Pain?  Yes    Pain Score  7     Pain Location  Abdomen    Pain Orientation  Left;Lower    Pain Descriptors / Indicators  Aching;Dull;Sharp    Pain Type  Chronic pain    Pain Onset  More than a month ago    Pain Frequency  Intermittent    Aggravating Factors   bending over, sitting too long, getting up too fast    Pain Relieving Factors  heat pad, Ibupofen, flexeril    Multiple Pain Sites  No         [facility] PT Assessment - 08/09/17 0001      Palpation   SI assessment   pelvis in correct alignment                Pelvic Floor Special Questions - 08/09/17 0001    Pelvic Floor Internal Exam  Patient confirms  identification and approves PT to assess muscle strength and treat    Exam Type  Vaginal    Strength  weak squeeze, no lift        [facility] Adult PT Treatment/Exercise - 08/09/17 0001      Neuro Re-ed    Neuro Re-ed Details   hypopressive breathing to contract the pelvic floor with tactile cues and went from 0/5 to 1/5      Lumbar Exercises: Aerobic   Nustep  level 3 x 5 min, arms #10 while assessing the patient      Manual Therapy   Manual Therapy  Internal Pelvic Floor;Soft tissue mobilization    Manual therapy comments  educated patient on how to use the dilator to for soft tissue work to the pelvic floor muscles and how to dilate the area    Soft tissue mobilization  right lower abdominal area    Internal Pelvic Floor  bil. sides of levator ani and obturator internist             PT  Education - 08/09/17 1702    Education provided  Yes    Education Details  how to use the vaginal massager to release the pelvic floor muscles    Person(s) Educated  Patient    Methods  Explanation;Demonstration;Handout    Comprehension  Returned demonstration;Verbalized understanding       PT Short Term Goals - 08/09/17 1614      PT SHORT TERM GOAL #1   Title  independent with initial HEP    Time  4    Period  Weeks    Status  Achieved      PT SHORT TERM GOAL #2   Title  pain with bending over decreased >/= 25% due to decrease tenderness and fascial restrictions    Baseline  pain level is 9/10    Time  4    Period  Weeks    Status  On-going      PT SHORT TERM GOAL #3   Title  urinary leakage decreased >/= 25% due to improved mobility of pelvic floor so she is able to contract correctly    Baseline  80% better    Time  4    Period  Weeks    Status  Achieved      PT SHORT TERM GOAL #4   Title  independent with abdominal massage to improve tissue mobitlity    Time  4    Period  Weeks    Status  Achieved        PT Long Term Goals - 07/13/17 1700      PT LONG TERM GOAL #1    Title  independent with HEP and understands how to progress herself    Time  4    Period  Months    Status  New    Target Date  11/12/17      PT LONG TERM GOAL #2   Title  able to use pain management techniques including internal soft tissue work independently    Time  4    Period  Months    Status  New    Target Date  11/12/17      PT LONG TERM GOAL #3   Title  ability to bend over with pain decreased >/= 75% due to improve tissue mobility of the abdominal and scar    Time  4    Period  Months    Status  New    Target Date  11/12/17      PT LONG TERM GOAL #4   Title  urinary leakage decreased >/= 75% due to improved tissue  mobility to perform a full circular contraction and lift    Time  4    Period  Months    Status  New    Target Date  08/10/17      PT LONG TERM GOAL #5   Title  abilty to sit and  get up quickly with pain decreased >/= 75% due to improved overall strength and mobility    Time  4    Period  Months    Status  New    Target Date  11/12/17            Plan - 08/09/17 1615    Clinical Impression Statement  Urinary leakage decreased by 80%. Patient is independent with abdominal massage.  Patient has many trigger points in the right lower abdominal area.  Patient has tightness in the pelvic floor muscle.  Patient understands how to  perform pelvic floor contraction when she does hypopressive breathing.  Patient has reduced her fried food to 1 time per week. Patient will benefit from skilled therapy go reduce her pain and improve strength and flexibility to restore function.     Rehab Potential  Excellent    Clinical Impairments Affecting Rehab Potential  Myomectomy 12/24/2013    PT Frequency  1x / week    PT Duration  Other (comment) 4 months    PT Treatment/Interventions  Biofeedback;Cryotherapy;Electrical Stimulation;Moist Heat;Ultrasound;Therapeutic exercise;Therapeutic activities;Neuromuscular re-education;Patient/family education;Passive range of  motion;Scar mobilization;Manual techniques;Dry needling    PT Next Visit Plan  scar massage,  diaphragmatic breathing, internal work    Oncologist with Plan of Care  Patient       Patient will benefit from skilled therapeutic intervention in order to improve the following deficits and impairments:  Increased fascial restricitons, Pain, Decreased mobility, Decreased scar mobility, Increased muscle spasms, Impaired tone, Decreased activity tolerance, Decreased endurance, Decreased strength, Decreased range of motion, Impaired flexibility  Visit Diagnosis: Muscle weakness (generalized)  Cramp and spasm  Other lack of coordination     Problem List Patient Active Problem List   Diagnosis Date Noted  . Fibroids 12/24/2013  . HYPERCHOLESTEROLEMIA 05/26/2006    Earlie Counts, PT 08/09/17 5:07 PM   [facility] [address] [place], [place], [address] Phone: [phone]   Fax:  [phone]  Name: Rebecca Allison MRN: [id number] Date of Birth: 1985-08-31

## 2017-08-16 ENCOUNTER — Encounter: Payer: Self-pay | Admitting: Physical Therapy

## 2017-08-16 ENCOUNTER — Ambulatory Visit: Payer: BLUE CROSS/BLUE SHIELD | Admitting: Physical Therapy

## 2017-08-16 DIAGNOSIS — M6281 Muscle weakness (generalized): Secondary | ICD-10-CM | POA: Diagnosis not present

## 2017-08-16 DIAGNOSIS — R278 Other lack of coordination: Secondary | ICD-10-CM

## 2017-08-16 DIAGNOSIS — R252 Cramp and spasm: Secondary | ICD-10-CM | POA: Diagnosis not present

## 2017-08-16 NOTE — Therapy (Signed)
[facility] [address], [place], [place], [address] Phone: [phone]   Fax:  [phone]  Physical Therapy Treatment  Patient Details  Name: Rebecca Allison MRN: [id number] Date of Birth: Dec 11, 1985 Referring Provider: Dr. Iona Coach Dasouki Abu-Alnadi    Encounter Date: 08/16/2017  PT End of Session - 08/16/17 1625    Visit Number  4    Date for PT Re-Evaluation  11/12/17    Authorization Type  BCBS    Authorization - Visit Number  4    Authorization - Number of Visits  29    PT Start Time  7628    PT Stop Time  1655    PT Time Calculation (min)  40 min    Activity Tolerance  Patient tolerated treatment well    Behavior During Therapy  [facility] for tasks assessed/performed       Past Medical History:  Diagnosis Date  . Shortness of breath    when climbing over 3 flts stairs    Past Surgical History:  Procedure Laterality Date  . MYOMECTOMY N/A 12/24/2013   Procedure: MYOMECTOMY;  Surgeon: Thurnell Lose, MD;  Location: [facility];  Service: Gynecology;  Laterality: N/A;  . NO PAST SURGERIES      There were no vitals filed for this visit.  Subjective Assessment - 08/16/17 1621    Subjective  I feel like I have to urinate all the time and I feel pressure on my bladder.  I have to go to the bathroom every 1 to 1.5 hours.     Patient Stated Goals  reduce pain    Currently in Pain?  No/denies    Multiple Pain Sites  No                    Pelvic Floor Special Questions - 08/16/17 0001    Pelvic Floor Internal Exam  Patient confirms identification and approves PT to assess muscle strength and treat    Exam Type  Vaginal    Strength  fair squeeze, definite lift        [facility] Adult PT Treatment/Exercise - 08/16/17 0001      Lumbar Exercises: Aerobic   Nustep  level 3 x 7 min, arms #10, seat #8      Lumbar Exercises: Supine   Other Supine Lumbar Exercises  lay on foam roll with opening the rib  cage with pnf patterns and breath, alternate shoulder flexion, , butterfly stretch      Lumbar Exercises: Quadruped   Other Quadruped Lumbar Exercises  abdominal contraction with tactile cues and breathing. abdominal bracing with rocking and diagonals      Manual Therapy   Manual Therapy  Internal Pelvic Floor    Internal Pelvic Floor  bil. sides of levator ani and obturator internist               PT Short Term Goals - 08/16/17 1623      PT SHORT TERM GOAL #2   Title  pain with bending over decreased >/= 25% due to decrease tenderness and fascial restrictions    Baseline  pain level 8/10    Time  4    Period  Weeks    Status  On-going      PT SHORT TERM GOAL #3   Title  urinary leakage decreased >/= 25% due to improved mobility of pelvic floor so she is able to contract correctly    Baseline  80%  better    Time  4    Period  Weeks    Status  Achieved        PT Long Term Goals - 08/16/17 1624      PT LONG TERM GOAL #3   Title  ability to bend over with pain decreased >/= 75% due to improve tissue mobility of the abdominal and scar    Baseline  8/10    Time  4    Status  On-going      PT LONG TERM GOAL #4   Title  urinary leakage decreased >/= 75% due to improved tissue  mobility to perform a full circular contraction and lift    Baseline  no leakage just pressure    Time  4    Period  Months    Status  On-going      PT LONG TERM GOAL #5   Title  abilty to sit and  get up quickly with pain decreased >/= 75% due to improved overall strength and mobility    Baseline  pain level 7/10; 25% better    Time  4    Period  Months    Status  On-going            Plan - 08/16/17 1626    Clinical Impression Statement  Patient reports no urinary leakage. After manual work pelvic floor strength increased to 3/5 with circular contraction.  Patient had trigger point in ischiococcygeus.  Patient did not have pain today.  Patient is able to feel the pelvic floor  contract with coughing.  Patient needs tactile cues to engage her abdominal in quadruped.  Patient feels like she has to urinate every 1to 1.5 ours due to muscle tightness.  Patient will benefit from skilled therapy to reduce her pain and improve strength and flexibility to restore function.     Rehab Potential  Excellent    Clinical Impairments Affecting Rehab Potential  Myomectomy 12/24/2013    PT Duration  Other (comment) 4 months    PT Treatment/Interventions  Biofeedback;Cryotherapy;Electrical Stimulation;Moist Heat;Ultrasound;Therapeutic exercise;Therapeutic activities;Neuromuscular re-education;Patient/family education;Passive range of motion;Scar mobilization;Manual techniques;Dry needling    PT Next Visit Plan  scar massage,  diaphragmatic breathing with trunk movement,  internal work    Oncologist with Plan of Care  Patient       Patient will benefit from skilled therapeutic intervention in order to improve the following deficits and impairments:  Increased fascial restricitons, Pain, Decreased mobility, Decreased scar mobility, Increased muscle spasms, Impaired tone, Decreased activity tolerance, Decreased endurance, Decreased strength, Decreased range of motion, Impaired flexibility  Visit Diagnosis: Muscle weakness (generalized)  Cramp and spasm  Other lack of coordination     Problem List Patient Active Problem List   Diagnosis Date Noted  . Fibroids 12/24/2013  . HYPERCHOLESTEROLEMIA 05/26/2006    Earlie Counts, PT 08/16/17 5:02 PM    [facility] [address], [place], [place], [address] Phone: [phone]   Fax:  [phone]  Name: Rebecca Allison MRN: [id number] Date of Birth: 11/02/1985

## 2017-08-23 ENCOUNTER — Encounter: Payer: Self-pay | Admitting: Physical Therapy

## 2017-08-23 ENCOUNTER — Ambulatory Visit: Payer: BLUE CROSS/BLUE SHIELD | Admitting: Physical Therapy

## 2017-08-23 DIAGNOSIS — R278 Other lack of coordination: Secondary | ICD-10-CM

## 2017-08-23 DIAGNOSIS — R252 Cramp and spasm: Secondary | ICD-10-CM

## 2017-08-23 DIAGNOSIS — M6281 Muscle weakness (generalized): Secondary | ICD-10-CM | POA: Diagnosis not present

## 2017-08-23 NOTE — Therapy (Signed)
[facility] [address] [place], [place], [address] Phone: [phone]   Fax:  [phone]  Physical Therapy Treatment  Patient Details  Name: Rebecca Allison MRN: [id number] Date of Birth: Aug 30, 1985 Referring Provider: Dr. Iona Coach Dasouki Abu-Alnadi    Encounter Date: 08/23/2017  PT End of Session - 08/23/17 1657    Visit Number  5    Date for PT Re-Evaluation  11/12/17    Authorization Type  BCBS    Authorization - Visit Number  5    Authorization - Number of Visits  71    PT Start Time  3976    PT Stop Time  1655    PT Time Calculation (min)  40 min    Activity Tolerance  Patient tolerated treatment well    Behavior During Therapy  [facility] for tasks assessed/performed       Past Medical History:  Diagnosis Date  . Shortness of breath    when climbing over 3 flts stairs    Past Surgical History:  Procedure Laterality Date  . MYOMECTOMY N/A 12/24/2013   Procedure: MYOMECTOMY;  Surgeon: Thurnell Lose, MD;  Location: [facility];  Service: Gynecology;  Laterality: N/A;  . NO PAST SURGERIES      There were no vitals filed for this visit.  Subjective Assessment - 08/23/17 1623    Subjective  I had a pain in lower abdoment. I picked up brick this weekend and that may strained herself.  I had pain 2 times.    Patient Stated Goals  reduce pain    Currently in Pain?  Yes    Pain Score  9     Pain Location  Abdomen    Pain Orientation  Right;Lower    Pain Descriptors / Indicators  Aching;Dull;Sharp    Pain Type  Chronic pain    Pain Onset  More than a month ago    Pain Frequency  Intermittent    Aggravating Factors   bending over , sitting too long, getting up to fast    Pain Relieving Factors  heat pad, Ibuprofen, flexeril    Multiple Pain Sites  No                       [facility] Adult PT Treatment/Exercise - 08/23/17 0001      Lumbar Exercises: Supine   Bent Knee Raise  15 reps;1 second     Bridge with clamshell  15 reps;1 second    Other Supine Lumbar Exercises  v position with hands on thighs going up and down small range and breathing    Other Supine Lumbar Exercises  hookly touch heel to work obliques      Manual Therapy   Manual Therapy  Soft tissue mobilization    Manual therapy comments  scar massage suprapubically    Soft tissue mobilization  right lower abdominal area; left abdominal after exercise due to cramp in muscle                PT Short Term Goals - 08/23/17 1701      PT SHORT TERM GOAL #2   Title  pain with bending over decreased >/= 25% due to decrease tenderness and fascial restrictions    Time  4    Period  Weeks    Status  Achieved        PT Long Term Goals - 08/16/17 1624      PT LONG  TERM GOAL #3   Title  ability to bend over with pain decreased >/= 75% due to improve tissue mobility of the abdominal and scar    Baseline  8/10    Time  4    Status  On-going      PT LONG TERM GOAL #4   Title  urinary leakage decreased >/= 75% due to improved tissue  mobility to perform a full circular contraction and lift    Baseline  no leakage just pressure    Time  4    Period  Months    Status  On-going      PT LONG TERM GOAL #5   Title  abilty to sit and  get up quickly with pain decreased >/= 75% due to improved overall strength and mobility    Baseline  pain level 7/10; 25% better    Time  4    Period  Months    Status  On-going            Plan - 08/23/17 1657    Clinical Impression Statement  Patient is having less pain.  She has weak abdominals and they will cramp after she has exercised them.  Patient still has restrictions in the abdominal scar.  Patient continues to have no urinary leakage.  Patient still has to frequently urinate.  Patient will benefit from skilled therapy to reduce her pain and improve strength and flexibitliy to restore function.     Rehab Potential  Excellent    Clinical Impairments Affecting Rehab  Potential  Myomectomy 12/24/2013    PT Frequency  1x / week    PT Duration  Other (comment) 4 months    PT Treatment/Interventions  Biofeedback;Cryotherapy;Electrical Stimulation;Moist Heat;Ultrasound;Therapeutic exercise;Therapeutic activities;Neuromuscular re-education;Patient/family education;Passive range of motion;Scar mobilization;Manual techniques;Dry needling    PT Next Visit Plan  scar massage,  diaphragmatic breathing with trunk movement,  internal work    Oncologist with Plan of Care  Patient       Patient will benefit from skilled therapeutic intervention in order to improve the following deficits and impairments:  Increased fascial restricitons, Pain, Decreased mobility, Decreased scar mobility, Increased muscle spasms, Impaired tone, Decreased activity tolerance, Decreased endurance, Decreased strength, Decreased range of motion, Impaired flexibility  Visit Diagnosis: Muscle weakness (generalized)  Other lack of coordination  Cramp and spasm     Problem List Patient Active Problem List   Diagnosis Date Noted  . Fibroids 12/24/2013  . HYPERCHOLESTEROLEMIA 05/26/2006    Earlie Counts, PT 08/23/17 5:03 PM   [facility] [address], [place], [place], [address] Phone: [phone]   Fax:  [phone]  Name: SHAQUANA BUEL MRN: [id number] Date of Birth: [birth year]

## 2017-08-30 ENCOUNTER — Ambulatory Visit: Payer: BLUE CROSS/BLUE SHIELD | Attending: Obstetrics & Gynecology | Admitting: Physical Therapy

## 2017-08-30 ENCOUNTER — Encounter: Payer: Self-pay | Admitting: Physical Therapy

## 2017-08-30 DIAGNOSIS — R278 Other lack of coordination: Secondary | ICD-10-CM | POA: Diagnosis not present

## 2017-08-30 DIAGNOSIS — M6281 Muscle weakness (generalized): Secondary | ICD-10-CM | POA: Diagnosis not present

## 2017-08-30 DIAGNOSIS — R252 Cramp and spasm: Secondary | ICD-10-CM | POA: Diagnosis not present

## 2017-08-30 NOTE — Therapy (Signed)
[facility] [address] [place], [place], [address] Phone: [phone]   Fax:  [phone]  Physical Therapy Treatment  Patient Details  Name: Rebecca Allison MRN: [id number] Date of Birth: 1985/07/01 Referring Provider: Dr. Iona Coach Dasouki Abu-Alnadi    Encounter Date: 08/30/2017  PT End of Session - 08/30/17 1624    Visit Number  6    Date for PT Re-Evaluation  11/12/17    Authorization Type  BCBS    Authorization - Visit Number  6    Authorization - Number of Visits  75    PT Start Time  3570    PT Stop Time  1655    PT Time Calculation (min)  40 min    Activity Tolerance  Patient tolerated treatment well    Behavior During Therapy  [facility] for tasks assessed/performed       Past Medical History:  Diagnosis Date  . Shortness of breath    when climbing over 3 flts stairs    Past Surgical History:  Procedure Laterality Date  . MYOMECTOMY N/A 12/24/2013   Procedure: MYOMECTOMY;  Surgeon: Thurnell Lose, MD;  Location: [facility];  Service: Gynecology;  Laterality: N/A;  . NO PAST SURGERIES      There were no vitals filed for this visit.                    [facility] Adult PT Treatment/Exercise - 08/30/17 0001      Therapeutic Activites    Therapeutic Activities  Lifting    Lifting  squat with wide base of support and increased hip flexion       Lumbar Exercises: Stretches   Active Hamstring Stretch  Right;Left;2 reps;30 seconds    Quadruped Mid Back Stretch  1 rep;30 seconds      Lumbar Exercises: Aerobic   Stationary Bike  level 3 for 8 min      Lumbar Exercises: Standing   Functional Squats  15 reps VC to keep legs apart, tighten abdominals      Manual Therapy   Manual Therapy  Soft tissue mobilization    Soft tissue mobilization  lower abdominal, along the scar,, adominal insertion along the pubic bone.               PT Education - 08/30/17 1657    Education provided  Yes     Education Details  squatting with abdominal contraction and increased hip flexion    Person(s) Educated  Patient    Methods  Explanation    Comprehension  Verbalized understanding       PT Short Term Goals - 08/23/17 1701      PT SHORT TERM GOAL #2   Title  pain with bending over decreased >/= 25% due to decrease tenderness and fascial restrictions    Time  4    Period  Weeks    Status  Achieved        PT Long Term Goals - 08/30/17 1634      PT LONG TERM GOAL #1   Title  independent with HEP and understands how to progress herself    Time  4    Period  Months      PT LONG TERM GOAL #3   Title  ability to bend over with pain decreased >/= 75% due to improve tissue mobility of the abdominal and scar    Baseline  7/10    Time  4  Period  Months    Status  On-going      PT LONG TERM GOAL #4   Title  urinary leakage decreased >/= 75% due to improved tissue  mobility to perform a full circular contraction and lift    Time  4    Period  Months    Status  Achieved      PT LONG TERM GOAL #5   Title  abilty to sit and  get up quickly with pain decreased >/= 75% due to improved overall strength and mobility    Time  4    Period  Months    Status  Achieved            Plan - 08/30/17 1625    Clinical Impression Statement  Patient is not having urinary leakage. Patient is now having a full bladder and able to fully empty it with a strong urine stream.  Patient still has tightness in her abdominal scar  When patient bends forward she has pain in suprapubic area due to not using her abdominals and flexing her hips and knees.  Patient will benefit from skilled therapy to reduce her pain and improve strength and flexibility to restore function.     Rehab Potential  Excellent    Clinical Impairments Affecting Rehab Potential  Myomectomy 12/24/2013    PT Frequency  1x / week    PT Duration  Other (comment) 4 months    PT Treatment/Interventions   Biofeedback;Cryotherapy;Electrical Stimulation;Moist Heat;Ultrasound;Therapeutic exercise;Therapeutic activities;Neuromuscular re-education;Patient/family education;Passive range of motion;Scar mobilization;Manual techniques;Dry needling    PT Next Visit Plan  scar massage,    internal work; work on abdominal contraction    Consulted and Agree with Plan of Care  Patient       Patient will benefit from skilled therapeutic intervention in order to improve the following deficits and impairments:  Increased fascial restricitons, Pain, Decreased mobility, Decreased scar mobility, Increased muscle spasms, Impaired tone, Decreased activity tolerance, Decreased endurance, Decreased strength, Decreased range of motion, Impaired flexibility  Visit Diagnosis: Muscle weakness (generalized)  Other lack of coordination  Cramp and spasm     Problem List Patient Active Problem List   Diagnosis Date Noted  . Fibroids 12/24/2013  . HYPERCHOLESTEROLEMIA 05/26/2006    Earlie Counts, PT 08/30/17 5:02 PM   [facility] [address] [place], [place], [address] Phone: [phone]   Fax:  [phone]  Name: Rebecca Allison MRN: [id number] Date of Birth: [birth year]

## 2017-09-27 ENCOUNTER — Encounter: Payer: Self-pay | Admitting: Physical Therapy

## 2017-09-27 ENCOUNTER — Ambulatory Visit: Payer: BLUE CROSS/BLUE SHIELD | Attending: Obstetrics & Gynecology | Admitting: Physical Therapy

## 2017-09-27 DIAGNOSIS — J209 Acute bronchitis, unspecified: Secondary | ICD-10-CM | POA: Diagnosis not present

## 2017-09-27 DIAGNOSIS — R278 Other lack of coordination: Secondary | ICD-10-CM | POA: Diagnosis not present

## 2017-09-27 DIAGNOSIS — M6281 Muscle weakness (generalized): Secondary | ICD-10-CM | POA: Insufficient documentation

## 2017-09-27 DIAGNOSIS — R252 Cramp and spasm: Secondary | ICD-10-CM | POA: Diagnosis not present

## 2017-09-27 NOTE — Therapy (Addendum)
[facility] [address], [place], [place], [address] Phone: [phone]   Fax:  [phone]  Physical Therapy Treatment  Patient Details  Name: Rebecca Allison MRN: [id number] Date of Birth: 20-Aug-1985 Referring Provider: Dr. Iona Coach Dasouki Abu-Alnaldi   Encounter Date: 09/27/2017  PT End of Session - 09/27/17 1627    Visit Number  7    Date for PT Re-Evaluation  11/12/17    Authorization Type  BC[employer]    Authorization - Visit Number  7    Authorization - Number of Visits  11    PT Start Time  2706    PT Stop Time  1655    PT Time Calculation (min)  40 min    Activity Tolerance  Patient tolerated treatment well    Behavior During Therapy  [facility] for tasks assessed/performed       Past Medical History:  Diagnosis Date  . Shortness of breath    when climbing over 3 flts stairs    Past Surgical History:  Procedure Laterality Date  . MYOMECTOMY N/A 12/24/2013   Procedure: MYOMECTOMY;  Surgeon: Thurnell Lose, MD;  Location: [facility];  Service: Gynecology;  Laterality: N/A;  . NO PAST SURGERIES      There were no vitals filed for this visit.  Subjective Assessment - 09/27/17 1622    Subjective  I have not really had pain and had some cramping pain that was 7-6/10. It started when I had a cold and coughing and sneezing alot.     Patient Stated Goals  reduce pain    Currently in Pain?  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 Iona Coach Dasouki Abu-Alnaldi    Onset Date/Surgical Date  12/24/13    Prior Therapy  None      Precautions   Precautions  None      Restrictions   Weight Bearing Restrictions  No      Balance Screen   Has the patient fallen in the past 6 months  No    Has the patient had a decrease in activity level because of a fear of falling?   No    Is the patient reluctant to leave their home because of a fear of falling?   No      Home Film/video editor residence      Prior Function   Level of Independence  Independent    Vocation  Full time employment    Vocation Requirements  sitting      Cognition   Overall Cognitive Status  Within Functional Limits for tasks assessed      Posture/Postural Control   Posture/Postural Control  No significant limitations      AROM  Lumbar Extension  full    Lumbar - Right Side Bend  full      Strength   Right Hip Extension  5/5    Right Hip ADduction  5/5    Left Hip ABduction  5/5      Palpation   Spinal mobility  full mobility of lower rib cage    SI assessment   pelvis in correct alignment    Palpation comment  tenderness located in lower abdominal      Transfers   Transfers  Not assessed      Ambulation/Gait   Ambulation/Gait  No                Pelvic Floor Special Questions - 09/27/17 0001    Urinary Leakage  No    Urinary urgency  Yes        [facility] Adult PT Treatment/Exercise - 09/27/17 0001      Lumbar Exercises: Aerobic   Stationary Bike  level 3 for 8 min      Manual Therapy   Manual Therapy  Soft tissue mobilization;Myofascial release    Manual therapy comments  scar massage suprapubically    Soft tissue mobilization  lower abdominal tissue, along the scar in lower abdominal    Myofascial Release  lower abdominal with going throught 3 planes of fascia to reduce pain               PT Short Term Goals - 08/23/17 1701      PT SHORT TERM GOAL #2    Title  pain with bending over decreased >/= 25% due to decrease tenderness and fascial restrictions    Time  4    Period  Weeks    Status  Achieved        PT Long Term Goals - 09/27/17 1629      PT LONG TERM GOAL #1   Title  independent with HEP and understands how to progress herself    Time  4    Period  Months    Status  Achieved      PT LONG TERM GOAL #2   Title  able to use pain management techniques including internal soft tissue work independently    Time  4    Period  Months    Status  Achieved      PT LONG TERM GOAL #3   Title  ability to bend over with pain decreased >/= 75% due to improve tissue mobility of the abdominal and scar    Time  4    Period  Months    Status  Achieved      PT LONG TERM GOAL #4   Title  urinary leakage decreased >/= 75% due to improved tissue  mobility to perform a full circular contraction and lift    Time  4    Period  Months    Status  Achieved      PT LONG TERM GOAL #5   Title  abilty to sit and  get up quickly with pain decreased >/= 75% due to improved overall strength and mobility    Time  4    Period  Months    Status  Achieved            Plan - 09/27/17 1628    Rehab Potential  Excellent    Clinical Impairments Affecting Rehab Potential  Myomectomy 12/24/2013    PT Frequency  1x / week    PT  Duration  Other (comment) 4 months    PT Treatment/Interventions  Biofeedback;Cryotherapy;Electrical Stimulation;Moist Heat;Ultrasound;Therapeutic exercise;Therapeutic activities;Neuromuscular re-education;Patient/family education;Passive range of motion;Scar mobilization;Manual techniques;Dry needling    Consulted and Agree with Plan of Care  Patient       Patient will benefit from skilled therapeutic intervention in order to improve the following deficits and impairments:  Increased fascial restricitons, Pain, Decreased mobility, Decreased scar mobility, Increased muscle spasms, Impaired tone, Decreased activity tolerance,  Decreased endurance, Decreased strength, Decreased range of motion, Impaired flexibility  Visit Diagnosis: Muscle weakness (generalized)  Other lack of coordination  Cramp and spasm     Problem List Patient Active Problem List   Diagnosis Date Noted  . Fibroids 12/24/2013  . HYPERCHOLESTEROLEMIA 05/26/2006    Earlie Counts, PT 09/27/17 5:01 PM   [facility] [address] [place], [place], [address] Phone: [phone]   Fax:  [phone]  Name: HARUKA KOWALESKI MRN: [id number] Date of Birth: [birth year]  PHYSICAL THERAPY DISCHARGE SUMMARY  Visits from Start of Care: 7  Current functional level related to goals / functional outcomes: See above.    Remaining deficits: See above.    Education / Equipment: HEP Plan: Patient agrees to discharge.  Patient goals were met. Patient is being discharged due to meeting the stated rehab goals.  Thank you for the referral. Earlie Counts, PT 09/27/17 5:03 PM  ?????

## 2017-10-11 DIAGNOSIS — N9489 Other specified conditions associated with female genital organs and menstrual cycle: Secondary | ICD-10-CM | POA: Diagnosis not present

## 2017-10-11 DIAGNOSIS — R102 Pelvic and perineal pain: Secondary | ICD-10-CM | POA: Diagnosis not present

## 2017-10-11 DIAGNOSIS — D251 Intramural leiomyoma of uterus: Secondary | ICD-10-CM | POA: Diagnosis not present

## 2017-10-11 DIAGNOSIS — G8929 Other chronic pain: Secondary | ICD-10-CM | POA: Diagnosis not present

## 2017-10-31 DIAGNOSIS — R609 Edema, unspecified: Secondary | ICD-10-CM | POA: Diagnosis not present

## 2017-11-01 DIAGNOSIS — H6983 Other specified disorders of Eustachian tube, bilateral: Secondary | ICD-10-CM | POA: Diagnosis not present

## 2017-11-01 DIAGNOSIS — H6613 Chronic tubotympanic suppurative otitis media, bilateral: Secondary | ICD-10-CM | POA: Diagnosis not present

## 2017-12-01 DIAGNOSIS — H6983 Other specified disorders of Eustachian tube, bilateral: Secondary | ICD-10-CM | POA: Diagnosis not present

## 2018-02-08 DIAGNOSIS — Z0000 Encounter for general adult medical examination without abnormal findings: Secondary | ICD-10-CM | POA: Diagnosis not present

## 2018-02-08 DIAGNOSIS — Z13220 Encounter for screening for lipoid disorders: Secondary | ICD-10-CM | POA: Diagnosis not present

## 2018-02-08 DIAGNOSIS — L83 Acanthosis nigricans: Secondary | ICD-10-CM | POA: Diagnosis not present

## 2018-02-08 DIAGNOSIS — Z114 Encounter for screening for human immunodeficiency virus [HIV]: Secondary | ICD-10-CM | POA: Diagnosis not present

## 2018-02-20 ENCOUNTER — Encounter: Payer: BLUE CROSS/BLUE SHIELD | Attending: Family Medicine | Admitting: Registered"

## 2018-02-20 ENCOUNTER — Encounter: Payer: Self-pay | Admitting: Registered"

## 2018-02-20 DIAGNOSIS — Z01419 Encounter for gynecological examination (general) (routine) without abnormal findings: Secondary | ICD-10-CM | POA: Diagnosis not present

## 2018-02-20 DIAGNOSIS — E119 Type 2 diabetes mellitus without complications: Secondary | ICD-10-CM | POA: Diagnosis not present

## 2018-02-20 DIAGNOSIS — Z713 Dietary counseling and surveillance: Secondary | ICD-10-CM | POA: Insufficient documentation

## 2018-02-20 NOTE — Patient Instructions (Addendum)
Instructions/Goals:  -Include 3 meals per day and a snack if meals are more than 4-5 hours apart. See snack list.   -Recommend 2-3 carbohydrate choices per meal (30-45 g carbohydrates per meal). Having a consistent amount of carbohydrates at each meal is most important.   -Starting Goal: Include at least 3 x 16 oz bottles of water/48 oz per day. Recommend including at least 64 oz water daily with starting goal of 48 oz per day.   -Include heart healthy unsaturated fats and less saturated fats (see handout).   -Make physical activity a part of your week. Try to include at least 30 minutes of physical activity 5 days each week or at least 150 minutes per week. Regular physical activity promotes overall health-including helping to reduce risk for heart disease and diabetes, promoting mental health, and helping [place] sleep better.    -Recommend talking with your doctor about monitoring blood sugar at home for additional information on how your habits and nutrition affect your blood sugar.

## 2018-02-20 NOTE — Progress Notes (Signed)
Diabetes Self-Management Education  Visit Type: First/Initial  Appt. Start Time: 1630 Appt. End Time: 1750  02/20/2018  Ms. Rebecca Allison, identified by name and date of birth, is a 32 y.o. female with a diagnosis of Diabetes: Type 2.   ASSESSMENT  There were no vitals taken for this visit. There is no height or weight on file to calculate BMI.   Pt reports that she usually eats 3 meals on weekdays. Pt works for [employer]. Pt reports that her grandmother has diabetes and has to take insulin. She reports that she wants to get a handle on her blood sugar to help prevent having to take insulin. Pt reports that insulin scares her. Pt reports she has not talked with her doctor about SMBG but she is open to doing so.   Diabetes Self-Management Education - 02/20/18 1643      Visit Information   Visit Type  First/Initial      Initial Visit   Diabetes Type  Type 2    Are you currently following a meal plan?  No    Are you taking your medications as prescribed?  Not on Medications    Date Diagnosed  02/08/18      Psychosocial Assessment   Patient Belief/Attitude about Diabetes  Afraid    Self-care barriers  Unable to determine    Self-management support  --   Boyfriend    Other persons present  Patient    Patient Concerns  Healthy Lifestyle;Glycemic Control;Nutrition/Meal planning    Special Needs  Unable to determine    Preferred Learning Style  No preference indicated    Learning Readiness  Ready    How often do you need to have someone help you when you read instructions, pamphlets, or other written materials from your doctor or pharmacy?  1 - Never    What is the last grade level you completed in school?  College       Pre-Education Assessment   Patient understands the diabetes disease and treatment process.  Needs Instruction    Patient understands incorporating nutritional management into lifestyle.  Needs Instruction    Patient undertands incorporating physical activity into  lifestyle.  Needs Instruction    Patient understands using medications safely.  Demonstrates understanding / competency    Patient understands monitoring blood glucose, interpreting and using results  Needs Instruction    Patient understands prevention, detection, and treatment of acute complications.  Needs Instruction    Patient understands prevention, detection, and treatment of chronic complications.  Needs Instruction    Patient understands how to develop strategies to address psychosocial issues.  Needs Instruction    Patient understands how to develop strategies to promote health/change behavior.  Needs Instruction      Complications   Last HgB A1C per patient/outside source  6.5 %    How often do you check your blood sugar?  0 times/day (not testing)    Fasting Blood glucose range (mg/dL)  --   Pt is a not checking BG at home.    Postprandial Blood glucose range (mg/dL)  --   Pt is not checking BG at home.    Number of hypoglycemic episodes per month  2   Pt reports some symptoms of hypoglycemia occuring within the past month on 2 occasions but reports she also has vertigo.    Can you tell when your blood sugar is low?  No    Number of hyperglycemic episodes per week  --   Pt is  not checking BG at home.    Have you had a dilated eye exam in the past 12 months?  Yes    Have you had a dental exam in the past 12 months?  Yes    Are you checking your feet?  Yes    How many days per week are you checking your feet?  3      Dietary Intake   Breakfast  --   Pt woke up around 11 AM   Lunch  (1:30 PM): Chicken alfredo    Snack (afternoon)  (4-430): Peach Cobbler ice cream    Snack (evening)  (8 PM): nacho, cheese and meat sauce    Beverage(s)  2 bottles of water (32 oz); 12 oz Dr. Malachi Bonds      Exercise   Exercise Type  Light (walking / raking leaves)    How many days per week to you exercise?  2    How many minutes per day do you exercise?  15    Total minutes per week of exercise   30      Patient Education   Previous Diabetes Education  No    Disease state   Definition of diabetes, type 1 and 2, and the diagnosis of diabetes;Factors that contribute to the development of diabetes    Nutrition management   Role of diet in the treatment of diabetes and the relationship between the three main macronutrients and blood glucose level;Food label reading, portion sizes and measuring food.;Reviewed blood glucose goals for pre and post meals and how to evaluate the patients' food intake on their blood glucose level.;Effects of alcohol on blood glucose and safety factors with consumption of alcohol.;Meal options for control of blood glucose level and chronic complications.;Carbohydrate counting    Physical activity and exercise   Role of exercise on diabetes management, blood pressure control and cardiac health.    Monitoring  Purpose and frequency of SMBG.;Interpreting lab values - A1C, lipid, urine microalbumina.;Yearly dilated eye exam;Daily foot exams    Acute complications  Taught treatment of hypoglycemia - the 15 rule.    Chronic complications  Relationship between chronic complications and blood glucose control;Lipid levels, blood glucose control and heart disease;Dental care;Nephropathy, what it is, prevention of, the use of ACE, ARB's and early detection of through urine microalbumia.;Reviewed with patient heart disease, higher risk of, and prevention;Retinopathy and reason for yearly dilated eye exams;Assessed and discussed foot care and prevention of foot problems      Individualized Goals (developed by patient)   Nutrition  Follow meal plan discussed;General guidelines for healthy choices and portions discussed    Physical Activity  Exercise 3-5 times per week;30 minutes per day    Medications  take my medication as prescribed    Monitoring   Other (comment)   Recommended pt discuss SMBG with her doctor   Reducing Risk  do foot checks daily;treat hypoglycemia with 15 grams  of carbs if blood glucose less than 70mg /dL;increase portions of healthy fats;examine blood glucose patterns      Post-Education Assessment   Patient understands the diabetes disease and treatment process.  Demonstrates understanding / competency    Patient understands incorporating nutritional management into lifestyle.  Demonstrates understanding / competency    Patient undertands incorporating physical activity into lifestyle.  Demonstrates understanding / competency    Patient understands using medications safely.  Demonstrates understanding / competency    Patient understands monitoring blood glucose, interpreting and using results  Demonstrates understanding / competency  Patient understands prevention, detection, and treatment of acute complications.  Demonstrates understanding / competency    Patient understands prevention, detection, and treatment of chronic complications.  Demonstrates understanding / competency    Patient understands how to develop strategies to address psychosocial issues.  Demonstrates understanding / competency    Patient understands how to develop strategies to promote health/change behavior.  Demonstrates understanding / competency      Outcomes   Expected Outcomes  Demonstrated interest in learning. Expect positive outcomes    Future DMSE  2 months    Program Status  Completed       Individualized Plan for Diabetes Self-Management Training:   Learning Objective:  Patient will have a greater understanding of diabetes self-management. Patient education plan is to attend individual and/or group sessions per assessed needs and concerns.   Plan:   Patient Instructions  Instructions/Goals:  -Include 3 meals per day and a snack if meals are more than 4-5 hours apart. See snack list.   -Recommend 2-3 carbohydrate choices per meal (30-45 g carbohydrates per meal). Having a consistent amount of carbohydrates at each meal is most important.   -Starting Goal:  Include at least 3 x 16 oz bottles of water/48 oz per day. Recommend including at least 64 oz water daily with starting goal of 48 oz per day.   -Include heart healthy unsaturated fats and less saturated fats (see handout).   -Make physical activity a part of your week. Try to include at least 30 minutes of physical activity 5 days each week or at least 150 minutes per week. Regular physical activity promotes overall health-including helping to reduce risk for heart disease and diabetes, promoting mental health, and helping [place] sleep better.    -Recommend talking with your doctor about monitoring blood sugar at home for additional information on how your habits and nutrition affect your blood sugar.        Expected Outcomes:  Demonstrated interest in learning. Expect positive outcomes  Education material provided: ADA Diabetes: Your Take Control Guide, My Plate, Snack sheet, Support group flyer and Carbohydrate counting sheet  If problems or questions, patient to contact team via:  Phone and MyChart  Future DSME appointment: 2 months

## 2018-05-05 ENCOUNTER — Encounter (HOSPITAL_COMMUNITY): Payer: Self-pay | Admitting: Emergency Medicine

## 2018-05-05 ENCOUNTER — Other Ambulatory Visit: Payer: Self-pay

## 2018-05-05 ENCOUNTER — Ambulatory Visit (HOSPITAL_COMMUNITY)
Admission: EM | Admit: 2018-05-05 | Discharge: 2018-05-05 | Disposition: A | Discharge status: Home / Self Care | Payer: BLUE CROSS/BLUE SHIELD | Attending: Internal Medicine | Admitting: Internal Medicine

## 2018-05-05 DIAGNOSIS — I10 Essential (primary) hypertension: Secondary | ICD-10-CM

## 2018-05-05 DIAGNOSIS — M549 Dorsalgia, unspecified: Secondary | ICD-10-CM

## 2018-05-05 DIAGNOSIS — R51 Headache: Secondary | ICD-10-CM

## 2018-05-05 DIAGNOSIS — M542 Cervicalgia: Secondary | ICD-10-CM

## 2018-05-05 MED ORDER — CYCLOBENZAPRINE HCL 5 MG PO TABS: 10.0000 mg | ORAL_TABLET | Freq: Three times a day (TID) | ORAL | 0 refills | Status: DC | PRN

## 2018-05-05 MED ORDER — ACETAMINOPHEN 325 MG PO TABS: 650.0000 mg | ORAL_TABLET | Freq: Four times a day (QID) | ORAL | Status: AC | PRN
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
## 2018-08-02 ENCOUNTER — Ambulatory Visit (INDEPENDENT_AMBULATORY_CARE_PROVIDER_SITE_OTHER): Payer: Managed Care, Other (non HMO) | Admitting: Orthopaedic Surgery

## 2018-08-02 ENCOUNTER — Encounter: Payer: Self-pay | Admitting: Orthopaedic Surgery

## 2018-08-02 ENCOUNTER — Other Ambulatory Visit: Payer: Self-pay

## 2018-08-02 DIAGNOSIS — M654 Radial styloid tenosynovitis [de Quervain]: Secondary | ICD-10-CM | POA: Diagnosis not present

## 2018-08-02 MED ORDER — DICLOFENAC SODIUM 1 % TD GEL: 2.0000 g | Freq: Four times a day (QID) | TRANSDERMAL | 1 refills | Status: DC

## 2018-08-02 NOTE — Progress Notes (Signed)
Office Visit Note   Patient: Rebecca Allison           Date of Birth: [birth year]           MRN: [id number] Visit Date: 08/02/2018              Requested by: Carol Ada, [place], [place] [address] PCP: Carol Ada, MD   Assessment & Plan: Visit Diagnoses:  1. De Quervain's tenosynovitis, right     Plan:  She will wear the thumb spica removable wrist splint for the next 2 weeks she can take it off for bathing and for gentle range of motion.  She will apply Voltaren gel 2 g 4 times daily to the first extensor compartment of the right wrist.  We will see her back in 2 weeks if she continues to have pain.  If she is trending towards improvement she can discontinue the splint at 2 weeks continue the Voltaren gel for up to 4 to 6 weeks.  If her pain pain continues past 6 weeks recommend return visit with possible cortisone injection for the de Quervain's syndrome.  Questions were encouraged and answered.  Handout on de Quervain's tenosynovitis was given.  Follow-Up Instructions: Return in about 2 weeks (around 08/16/2018).   Orders:  No orders of the defined types were placed in this encounter.  Meds ordered this encounter  Medications  . diclofenac sodium (VOLTAREN) 1 % GEL    Sig: Apply 2 g topically 4 (four) times daily.    Dispense:  1 Tube    Refill:  1      Procedures: No procedures performed   Clinical Data: No additional findings.   Subjective: Chief Complaint  Patient presents with  . Right Wrist - Pain    HPI Rebecca Allison is a 33 year old patient with right wrist pain for the past 3 to 4 weeks no particular injury.  She has been doing a lot of typing at home and states that the ergonomics are not the same as they are at her workplace.  She notes pain the dorsal aspect of the wrist pointing to the first extensor compartment.  She has pain with motion of the thumb.  She is tried a removable wrist splint without a thumb spica which is  helped some.  She is also tried ibuprofen which has not helped.  She is recently been diagnosed with diabetes mellitus reports her hemoglobin A1c is 6.7.  She occasionally has some numbness in her hand that does not awaken her at night.  Review of Systems Denies any fevers chills shortness of breath chest pain.  Objective: Vital Signs: There were no vitals taken for this visit.  Physical Exam General: Well-developed well-nourished female no acute distress mood affect appropriate. Psych: Alert and oriented x3. Ortho Exam Bilateral wrist good range of motion without pain.  Full sensation bilateral hands light touch.  Motor is full but she does have significant pain with extension of the right thumb.  There is no triggering throughout the thumb and the fingers of both hands.  No rashes skin lesions ulcerations.  No edema or ecchymosis appreciated either hand or wrist.  Positive Finkelstein on the right negative on the left.  Grind test right thumb is negative.  No tenderness right forearm or right elbow medial lateral epicondyles.  Right forearm compartments are soft and nontender. Specialty Comments:  No specialty comments available.  Imaging: No results found.   PMFS History: Patient Active  Problem List   Diagnosis Date Noted  . Fibroids 12/24/2013  . HYPERCHOLESTEROLEMIA 05/26/2006   Past Medical History:  Diagnosis Date  . Hyperlipidemia   . Hypertension   . Shortness of breath    when climbing over 3 flts stairs    Family History  Problem Relation Age of Onset  . Hypertension Mother   . Hypertension Maternal Grandmother   . Hyperlipidemia Maternal Grandmother   . Diabetes Maternal Grandmother     Past Surgical History:  Procedure Laterality Date  . MYOMECTOMY N/A 12/24/2013   Procedure: MYOMECTOMY;  Surgeon: Thurnell Lose, MD;  Location: [facility];  Service: Gynecology;  Laterality: N/A;  . NO PAST SURGERIES     Social History   Occupational History  . Not on file   Tobacco Use  . Smoking status: Never Smoker  . Smokeless tobacco: Never Used  Substance and Sexual Activity  . Alcohol use: No  . Drug use: No  . Sexual activity: Yes    Birth control/protection: Pill

## 2018-08-09 ENCOUNTER — Telehealth: Payer: Self-pay | Admitting: Orthopaedic Surgery

## 2018-08-09 NOTE — Telephone Encounter (Signed)
Let her know the she should go to [employer] and type in "[employer] tenosynovitis exercises and stretching" and select images and videos.  She will find a lot of information for this on the intranet.

## 2018-08-09 NOTE — Telephone Encounter (Signed)
Sent this to her in an email

## 2018-08-09 NOTE — Telephone Encounter (Signed)
Please advise 

## 2018-08-09 NOTE — Telephone Encounter (Signed)
Pt called asking if there is any exercises that she can do for the tendentious in her right hand.   pt asked if it could be emailed to her at  [email or web address]

## 2018-08-16 ENCOUNTER — Ambulatory Visit: Payer: Managed Care, Other (non HMO) | Admitting: Orthopaedic Surgery

## 2018-09-06 ENCOUNTER — Ambulatory Visit (INDEPENDENT_AMBULATORY_CARE_PROVIDER_SITE_OTHER): Payer: Managed Care, Other (non HMO) | Admitting: Orthopaedic Surgery

## 2018-09-06 ENCOUNTER — Encounter: Payer: Self-pay | Admitting: Orthopaedic Surgery

## 2018-09-06 ENCOUNTER — Other Ambulatory Visit: Payer: Self-pay

## 2018-09-06 DIAGNOSIS — M654 Radial styloid tenosynovitis [de Quervain]: Secondary | ICD-10-CM | POA: Diagnosis not present

## 2018-09-06 MED ORDER — METHYLPREDNISOLONE ACETATE 40 MG/ML IJ SUSP
40.0000 mg | INTRAMUSCULAR | Status: AC | PRN
  Administered 2018-09-06: 40 mg

## 2018-09-06 MED ORDER — LIDOCAINE HCL 1 % IJ SOLN
1.0000 mL | INTRAMUSCULAR | Status: AC | PRN
  Administered 2018-09-06: 1 mL

## 2018-09-06 NOTE — Progress Notes (Signed)
   Procedure Note  Patient: Rebecca Allison             Date of Birth: [birth year]           MRN: [id number]             Visit Date: 09/06/2018  HPI: Rebecca Allison returns today due to right wrist pain.  She is been wearing a Velcro splint using Voltaren gel over the first extensor compartment of the right wrist states that she does not feel the Voltaren gel is helped.  She is been doing some exercises for de Quervain's feels these helped some.  She is unsure if the braces helped.  Recently diagnosed with diabetes and states that her fasting glucoses been running about 120 and  glucose levels later in the day are 140-160. She notes that the wrist feels like it locks up at times.  Denies any locking of the thumb.  Denies any numbness tingling the hand.  Physical exam: Right wrist positive Finkelstein's.  She is tender first extensor compartment.  Do not feel any triggering or locking.  She does have a palpable nodule at the thumb A1 pulley but no active triggering.  Hand otherwise vascular intact.  No rashes skin lesions ulcerations.  Procedures: Visit Diagnoses: De Quervain's tenosynovitis, right  Hand/UE Inj: R extensor compartment 1 for de Quervain's tenosynovitis on 09/06/2018 9:23 AM Details: lateral approach Medications: 1 mL lidocaine 1 %; 40 mg methylPREDNISolone acetate 40 MG/ML Consent was given by the patient. Immediately prior to procedure a time out was called to verify the correct patient, procedure, equipment, support staff and site/side marked as required. Patient was prepped and draped in the usual sterile fashion.     Plan: Recommend that she continue to wear the splint until pain resolves.  She can come out of exercise.  Also for icing the area glucose levels over the next few days then she understands that the steroid definitely could increase her glucose levels.  If she continues back pain despite these conservative measures in the next 3 weeks she will call our office or obtain  an MRI to evaluate for tendon pathology.

## 2018-09-27 ENCOUNTER — Ambulatory Visit (INDEPENDENT_AMBULATORY_CARE_PROVIDER_SITE_OTHER): Payer: Managed Care, Other (non HMO) | Admitting: Orthopaedic Surgery

## 2018-09-27 ENCOUNTER — Encounter: Payer: Self-pay | Admitting: Orthopaedic Surgery

## 2018-09-27 ENCOUNTER — Other Ambulatory Visit: Payer: Self-pay

## 2018-09-27 DIAGNOSIS — M654 Radial styloid tenosynovitis [de Quervain]: Secondary | ICD-10-CM | POA: Diagnosis not present

## 2018-09-27 NOTE — Progress Notes (Signed)
The patient is a very pleasant right-hand-dominant female who comes in about 3 weeks after a steroid injection in the first dorsal compartment of her right wrist secondary to de Quervain's tenosynovitis.  She had had significant pain prior to coming in.  She does a lot of typing.  She said the injection has helped greatly and her pain is minimal.  She is also been wearing a brace.  On exam there is no adverse reaction of the skin from a steroid injection over the right wrist.  Her Wynn Maudlin test is negative.  Her range of motion is full.  She has essentially almost no pain.  She is well-perfused with her hand and neurovascularly intact.  At this point follow-up as needed since she is doing well.  I have recommended Voltaren gel as a topical anti-inflammatory she can try if things worsen in any way.  All question concerns were answered and addressed.

## 2019-07-09 ENCOUNTER — Other Ambulatory Visit: Payer: Self-pay

## 2019-07-09 ENCOUNTER — Encounter: Payer: Self-pay | Admitting: Physician Assistant

## 2019-07-09 ENCOUNTER — Ambulatory Visit (INDEPENDENT_AMBULATORY_CARE_PROVIDER_SITE_OTHER): Payer: Managed Care, Other (non HMO) | Admitting: Physician Assistant

## 2019-07-09 DIAGNOSIS — M654 Radial styloid tenosynovitis [de Quervain]: Secondary | ICD-10-CM | POA: Diagnosis not present

## 2019-07-09 MED ORDER — METHYLPREDNISOLONE ACETATE 40 MG/ML IJ SUSP
40.0000 mg | INTRAMUSCULAR | Status: AC | PRN
  Administered 2019-07-09: 40 mg

## 2019-07-09 MED ORDER — LIDOCAINE HCL 1 % IJ SOLN
1.0000 mL | INTRAMUSCULAR | Status: AC | PRN
  Administered 2019-07-09: 16:00:00 1 mL

## 2019-07-09 NOTE — Progress Notes (Signed)
Office Visit Note   Patient: Rebecca Allison           Date of Birth: 10-06-1985           MRN: [id number] Visit Date: 07/09/2019              Requested by: Carol Ada, [place],  [place] [address] PCP: Carol Ada, MD   Assessment & Plan: Visit Diagnoses:  1. De Quervain's tenosynovitis, right     Plan: Asked her to make sure that ergonomically as her computer keyboard at a comfortable level.  She can use the wrist splint as needed especially when typing.  She will monitor her glucose levels over the next couple of days she understands that the injection can raise her sugars.  She will follow-up with [place] as needed.  Questions encouraged and answered.  Follow-Up Instructions: No follow-ups on file.   Orders:  Orders Placed This Encounter  Procedures  . Hand/UE Inj: R extensor compartment 1   No orders of the defined types were placed in this encounter.     Procedures: Hand/UE Inj: R extensor compartment 1 for de Quervain's tenosynovitis on 07/09/2019 4:14 PM Indications: pain and therapeutic Details: radial approach Medications: 1 mL lidocaine 1 %; 40 mg methylPREDNISolone acetate 40 MG/ML Consent was given by the patient. Immediately prior to procedure a time out was called to verify the correct patient, procedure, equipment, support staff and site/side marked as required. Patient was prepped and draped in the usual sterile fashion.       Clinical Data: No additional findings.   Subjective: Chief Complaint  Patient presents with  . Right Wrist - Injections, Pain    HPI Rebecca Allison is a 34 year old female who was seen for right wrist pain.  She was seen last June that time was given an injection for de Quervain's tenosynovitis of the right wrist.  The injection helped.  However 4 weeks ago she started developing pain over the first extensor compartment of her right wrist.  No known injury.  She does do a lot of stress typing.   She denies any numbness tingling of the hand.  Denies any triggering the fingers of the right hand.  She is diabetic reports good control.  Review of Systems Negative for fevers,chills. See HPI.   Objective: Vital Signs: There were no vitals taken for this visit.  Physical Exam Constitutional:      Appearance: She is not ill-appearing or diaphoretic.  Pulmonary:     Effort: Pulmonary effort is normal.  Neurological:     Mental Status: She is alert and oriented to person, place, and time.  Psychiatric:        Mood and Affect: Mood normal.     Ortho Exam Right wrist good range of motion without significant pain.  Positive Finkelstein's on the right negative on the left.  Right thumb grind test is negative.  There is no rashes skin lesions ulcerations about the right wrist or hand.  No triggering of fingers.  Sensation grossly intact throughout the hand.  Radial pulses 2+. Specialty Comments:  No specialty comments available.  Imaging: No results found.   PMFS History: Patient Active Problem List   Diagnosis Date Noted  . Fibroids 12/24/2013  . HYPERCHOLESTEROLEMIA 05/26/2006   Past Medical History:  Diagnosis Date  . Hyperlipidemia   . Hypertension   . Shortness of breath    when climbing over 3 flts stairs  Family History  Problem Relation Age of Onset  . Hypertension Mother   . Hypertension Maternal Grandmother   . Hyperlipidemia Maternal Grandmother   . Diabetes Maternal Grandmother     Past Surgical History:  Procedure Laterality Date  . MYOMECTOMY N/A 12/24/2013   Procedure: MYOMECTOMY;  Surgeon: Thurnell Lose, MD;  Location: [facility];  Service: Gynecology;  Laterality: N/A;  . NO PAST SURGERIES     Social History   Occupational History  . Not on file  Tobacco Use  . Smoking status: Never Smoker  . Smokeless tobacco: Never Used  Substance and Sexual Activity  . Alcohol use: No  . Drug use: No  . Sexual activity: Yes    Birth control/protection:  Pill

## 2020-02-28 ENCOUNTER — Ambulatory Visit: Payer: Managed Care, Other (non HMO) | Admitting: Physician Assistant

## 2020-03-03 ENCOUNTER — Encounter: Payer: Self-pay | Admitting: Physician Assistant

## 2020-03-03 ENCOUNTER — Ambulatory Visit: Payer: Managed Care, Other (non HMO) | Admitting: Physician Assistant

## 2020-03-03 DIAGNOSIS — M654 Radial styloid tenosynovitis [de Quervain]: Secondary | ICD-10-CM

## 2020-03-03 MED ORDER — LIDOCAINE HCL 1 % IJ SOLN
1.0000 mL | INTRAMUSCULAR | Status: AC | PRN
  Administered 2020-03-03: 1 mL

## 2020-03-03 MED ORDER — METHYLPREDNISOLONE ACETATE 40 MG/ML IJ SUSP
40.0000 mg | INTRAMUSCULAR | Status: AC | PRN
  Administered 2020-03-03: 40 mg

## 2020-03-03 NOTE — Progress Notes (Signed)
   Procedure Note  Patient: Rebecca Allison             Date of Birth: December 14, 1985           MRN: [id number]             Visit Date: 03/03/2020 HPI: Rebecca Allison returns today with the right wrist pain. Saw her in April 2021 at that time she was diagnosed with de Quervain's right first extensor compartment and was given an injection.  States that the injection helped until the end of November.  Has had no new injury.  She has tried changing up her keypad with a more ergonomic family design.  She has tried Voltaren gel over the area with no real relief.  Review of systems: Denies any fevers chills or recent vaccines.  Physical exam: Right wrist she has full range of motion of the fingers.  Good range of motion of the wrist without pain negative grind test right thumb.  Positive Finkelstein's and tenderness over the right first extensor compartment.  First extensor compartment without edema or erythema.  Procedures: Visit Diagnoses:  1. De Quervain's tenosynovitis, right     Hand/UE Inj: R extensor compartment 1 for de Quervain's tenosynovitis on 03/03/2020 10:05 AM Medications: 1 mL lidocaine 1 %; 40 mg methylPREDNISolone acetate 40 MG/ML    Plan she will follow-up with [place] as needed.  Patient tolerated injection well.  Questions encouraged and answered

## 2020-04-24 ENCOUNTER — Ambulatory Visit: Payer: Managed Care, Other (non HMO) | Admitting: Physician Assistant

## 2020-04-24 ENCOUNTER — Encounter: Payer: Self-pay | Admitting: Physician Assistant

## 2020-04-24 DIAGNOSIS — M654 Radial styloid tenosynovitis [de Quervain]: Secondary | ICD-10-CM | POA: Diagnosis not present

## 2020-04-24 NOTE — Progress Notes (Signed)
HPI: Ms. Beringer returns today with right wrist pain.  She states the last injection only helped for about 3 weeks.  She states she is now having burning and a popping-like sensation at the wrist and the first extensor compartment region.  She is diabetic is been diabetic for approximately 3 years now she reports overall good control of her diabetes.  She has tried Voltaren gel in the past with no relief.  She is also tried a wrist splint and it really does not help.  She does a lot of typing at her job.  Denies any injury to the wrist.  Physical exam: Right wrist positive Finkelstein's test.  She has tenderness over the first extensor compartment.  Negative grind test.  No abnormal warmth erythema over the wrist.  Impression: Right wrist de Quervain's syndrome.  Plan: Recommend we send her to formal physical therapy the right to the de Quervain's syndrome.  She will follow-up with Dr. Ninfa Linden in 1 month to see how she is doing overall.  Questions encouraged and answered at length.

## 2020-04-24 NOTE — Addendum Note (Signed)
Addended by: Robyne Peers on: 04/24/2020 04:32 PM   Modules accepted: Orders

## 2020-05-16 ENCOUNTER — Ambulatory Visit: Payer: Managed Care, Other (non HMO) | Admitting: Physical Therapy

## 2020-05-31 ENCOUNTER — Ambulatory Visit: Payer: Managed Care, Other (non HMO) | Attending: Physician Assistant

## 2020-05-31 ENCOUNTER — Other Ambulatory Visit: Payer: Self-pay

## 2020-05-31 DIAGNOSIS — M654 Radial styloid tenosynovitis [de Quervain]: Secondary | ICD-10-CM | POA: Diagnosis not present

## 2020-05-31 DIAGNOSIS — M79644 Pain in right finger(s): Secondary | ICD-10-CM | POA: Diagnosis present

## 2020-05-31 DIAGNOSIS — G8929 Other chronic pain: Secondary | ICD-10-CM | POA: Diagnosis present

## 2020-05-31 DIAGNOSIS — M25641 Stiffness of right hand, not elsewhere classified: Secondary | ICD-10-CM | POA: Diagnosis present

## 2020-06-01 NOTE — Therapy (Signed)
[facility] [place], [place], [address] Phone: [phone]   Fax:  [phone]  Physical Therapy Evaluation  Patient Details  Name: Rebecca Allison MRN: [id number] Date of Birth: 1985/10/25 Referring Provider (PT): Pete Pelt, [place]   Encounter Date: 05/31/2020   PT End of Session - 06/01/20 2222    Visit Number 1    Number of Visits 7    Date for PT Re-Evaluation 07/19/20    Authorization Type [employer] MANAGED    PT Start Time 0903    PT Stop Time 0950    PT Time Calculation (min) 47 min    Activity Tolerance Patient tolerated treatment well    Behavior During Therapy [facility] for tasks assessed/performed           Past Medical History:  Diagnosis Date  . Hyperlipidemia   . Hypertension   . Shortness of breath    when climbing over 3 flts stairs    Past Surgical History:  Procedure Laterality Date  . MYOMECTOMY N/A 12/24/2013   Procedure: MYOMECTOMY;  Surgeon: Thurnell Lose, MD;  Location: [facility];  Service: Gynecology;  Laterality: N/A;  . NO PAST SURGERIES      There were no vitals filed for this visit.    Subjective Assessment - 06/01/20 2203    Subjective Pt reports having tendinitis of her R thumb for over 2 yrs. She received a cortizone injection that resolved her pain for a year, but the pain returned. Another cortizone injection resolved her pain for 6 months. Currently, her R thumb pain has been bothering for approx. 2 months, and her MD wants to wait before giving another injection. 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    Limitations Writing    Patient Stated Goals For the pain to subside    Currently in Pain? No/denies    Pain Score 8    high level of pain   Pain Location Wrist   thumb   Pain Orientation Right    Pain Descriptors / Indicators Burning;Sharp    Pain Type Chronic pain    Pain Onset More than a month ago    Pain Frequency Intermittent     Aggravating Factors  typing, brushing teeth- any thumb movement    Pain Relieving Factors rest              [facility] PT Assessment - 06/01/20 0001      Assessment   Medical Diagnosis De Quervain's tenosynovitis, right    Referring Provider (PT) Pete Pelt, PA-C    Onset Date/Surgical Date --   2 years ago   Hand Dominance Right    Prior Therapy No      Precautions   Precautions None      Restrictions   Weight Bearing Restrictions No      Balance Screen   Has the patient fallen in the past 6 months No      [place] residence    Living Arrangements Parent      Prior Function   Level of Independence Independent    Vocation Full time employment    [employer]- a lot of typing. Pt uses cold wist wraps to manage pain. Pt uses an ergonomic key board and mouse      Cognition   Overall Cognitive Status Within Functional Limits for tasks assessed      Observation/Other Assessments  Focus on Therapeutic Outcomes (FOTO)  51% functional ability (spare 61.1)      Sensation   Light Touch Appears Intact      ROM / Strength   AROM / PROM / Strength AROM;Strength;PROM      AROM   Overall AROM Comments AROM for thumb flexion and ext provoked pain. [place] jt ext was limited to neutral, PIP and MCP were WNLs. L CMC ext ROM= 40d      PROM   Overall PROM Comments Passive R thumb flexion and ext both provoked pain.      Strength   Overall Strength Comments Grip R=19lbs, L 22lbs Resisted R thumb ext provoked pain, abd was negative.      Palpation   Palpation comment TTP of the ext pollicus brevis and abd pollicus lungus along the lateral radial wrist joint.      Special Tests   Other special tests de Quervain's tendiitis test R thumb is positive                      Objective measurements completed on examination: See above findings.       [facility] Adult PT Treatment/Exercise - 06/01/20 0001       Self-Care   Self-Care Other Self-Care Comments;Heat/Ice Application    Heat/Ice Application Ice massage    Other Self-Care Comments  Cross fricition massage      Exercises   Exercises Wrist      Wrist Exercises   Other wrist exercises De Quervain's stretch 3x, 20 sec   gentle, as tolerated     Manual Therapy   Manual Therapy Soft tissue mobilization    Manual therapy comments Ice masage to the lateral radial thumb tendons for 3 mins    Soft tissue mobilization Cross friction massge to the thumb tendons of the lateral radial wrist for 5 mins                  PT Education - 06/01/20 2220    Education Details Eval finding, POC, HEP-Cross friction massage f'/b de Quervain's stretch 3x per day, ice massage/cold pack to reduce pain and inflamation as needed    Person(s) Educated Patient    Methods Explanation;Demonstration;Tactile cues;Verbal cues    Comprehension Verbalized understanding;Returned demonstration;Verbal cues required;Tactile cues required;Need further instruction            PT Short Term Goals - 06/01/20 2328      PT SHORT TERM GOAL #1   Title Pt will be Ind in an initial HEP/treatment program    Status New    Target Date 06/22/20      PT SHORT TERM GOAL #2   Title pt will voice understanding of measures to reduce and manage pain    Status New    Target Date 06/22/20             PT Long Term Goals - 06/01/20 2330      PT LONG TERM GOAL #1   Title Pt will be Ind in a final HEP/treatment program to maintain or progress achieved level of function    Status New    Target Date 07/19/20      PT LONG TERM GOAL #2   Title Pt will report and improved level of R thumb pain to decreased frquency and level of pain of 4/10 or less    Baseline 8/10    Status New    Target Date 07/19/20      PT  LONG TERM GOAL #3   Title Pt's R thumb ext at the [facility] jt will increase to 30d for improve functional use    Baseline neutral    Status New    Target Date  07/19/20      PT LONG TERM GOAL #4   Title Pt's FOTO score will improve to the predicted value of 65% functional ability    Baseline 51%    Status New    Target Date 07/19/20                  Plan - 06/01/20 2229    Clinical Impression Statement Pt presents with a chronic Hx 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 Pt works in a position which involves a significant amount of typing. Grip strength of the R hand was slightly less than the L, although the pt is R handed. Thumb abd ROM was WNLs, while AROM of ext at the [facility] jt was decreased due to pain. An initial treatment program of cross friction massage, gentle stretches, and cold treatment was established. Anticipate slow progress due to the length of time the pt's symptoms and the repetitive nature of the pt's work. Pt will benefit from skilled PT 1w6 to reduce pain and optimize function.    Personal Factors and Comorbidities Time since onset of injury/illness/exacerbation    Examination-Activity Limitations Other   use of R hand   Examination-Participation Restrictions Occupation    Stability/Clinical Decision Making Stable/Uncomplicated    Clinical Decision Making Low    Rehab Potential Fair    PT Frequency 1x / week    PT Duration 6 weeks    PT Treatment/Interventions ADLs/Self Care Home Management;Cryotherapy;Electrical Stimulation;Contrast Bath;Ultrasound;Iontophoresis 4mg /ml Dexamethasone;Therapeutic exercise;Manual techniques;Patient/family education;Passive range of motion;Dry needling;Splinting;Taping;Joint Manipulations    PT Next Visit Plan Assess response to treatment plan. Review FOTO.    PT Home Exercise Plan Treatment plan in Education    Consulted and Agree with Plan of Care Patient           Patient will benefit from skilled therapeutic intervention in order to improve the following deficits and impairments:  Impaired UE functional use,Decreased range of motion,Pain  Visit Diagnosis: De Quervain's  tenosynovitis, right  Chronic pain of right thumb  Decreased range of motion of right thumb     Problem List Patient Active Problem List   Diagnosis Date Noted  . Fibroids 12/24/2013  . HYPERCHOLESTEROLEMIA 05/26/2006   Gar Ponto MS, PT 06/01/20 11:48 PM  [facility] [address] [place], [place], [address] Phone: [phone]   Fax:  [phone]  Name: Rebecca Allison MRN: [id number] Date of Birth: 18-Aug-1985

## 2020-06-13 ENCOUNTER — Telehealth: Payer: Self-pay | Admitting: Physician Assistant

## 2020-06-13 NOTE — Telephone Encounter (Signed)
Received $25.00 money order,medical records release form and disability paperwork    Forwarding to [facility]

## 2020-06-14 ENCOUNTER — Encounter: Payer: Self-pay | Admitting: Rehabilitative and Restorative Service Providers"

## 2020-06-14 ENCOUNTER — Ambulatory Visit: Payer: Managed Care, Other (non HMO) | Admitting: Rehabilitative and Restorative Service Providers"

## 2020-06-14 ENCOUNTER — Other Ambulatory Visit: Payer: Self-pay

## 2020-06-14 DIAGNOSIS — M25641 Stiffness of right hand, not elsewhere classified: Secondary | ICD-10-CM

## 2020-06-14 DIAGNOSIS — M654 Radial styloid tenosynovitis [de Quervain]: Secondary | ICD-10-CM

## 2020-06-14 DIAGNOSIS — G8929 Other chronic pain: Secondary | ICD-10-CM

## 2020-06-14 NOTE — Therapy (Signed)
[place], [place], [address] Phone: [phone]   Fax:  [phone]  Physical Therapy Treatment  Patient Details  Name: Rebecca Allison MRN: [id number] Date of Birth: Sep 22, 1985 Referring Provider (PT): Pete Pelt, [place]   Encounter Date: 06/14/2020   PT End of Session - 06/14/20 0956    Visit Number 2    PT Start Time 0814    PT Stop Time 4818    PT Time Calculation (min) 63 min    Activity Tolerance Patient tolerated treatment well;No increased pain    Behavior During Therapy WFL for tasks assessed/performed           Past Medical History:  Diagnosis Date  . Hyperlipidemia   . Hypertension   . Shortness of breath    when climbing over 3 flts stairs    Past Surgical History:  Procedure Laterality Date  . MYOMECTOMY N/A 12/24/2013   Procedure: MYOMECTOMY;  Surgeon: Thurnell Lose, MD;  Location: [facility];  Service: Gynecology;  Laterality: N/A;  . NO PAST SURGERIES      There were no vitals filed for this visit.   Subjective Assessment - 06/14/20 1214    Subjective I am doing worse. These fingers are hurting now (first and second digits) and am having shooting pains going up my arm.    Currently in Pain? Yes    Pain Score 8     Pain Location Wrist    Pain Orientation Right    Pain Descriptors / Indicators Sharp;Shooting    Pain Type Chronic pain    Pain Onset More than a month ago    Multiple Pain Sites No                             [facility] Adult PT Treatment/Exercise - 06/14/20 0001      Wrist Exercises   Other wrist exercises muscle pump exercises after retrograde massage with elbow flex/ext, shoulder flex/ext, cervical rot bil x 10 each after retrograde performed periodically. Reviewed HEP with pt able to perform correctly.      Modalities   Modalities Ultrasound      Ultrasound   Ultrasound Location R thumb, anatomical snuff box, distal radial region     Ultrasound Parameters 50%, 1.5 w/cm2 x 8 min    Ultrasound Goals Other (Comment)   swelling and pain     Manual Therapy   Manual therapy comments retrograde massage for swelling present in anatomical snuff box and between first and second digits up to anterior elbow. Gentle wrist distraction, manual palmar stretch, gentle thumb stretching all planes; grade 1-2 first and 2nd digit metatarsal mobs throughout. Ice massage with Dixie cup x 3 min to anatomical snuff box  and thumb.                  PT Education - 06/14/20 1221    Education Details issued pt retrograde massage handout from website https://[email or web address].[email or web address].biz/ along with ice massage instructions with ice cube until melted or 3 min, whichever is first,or dixie cup x 3-5 min or ice pack x 10 min (pt was performing ice massage x 10 min). Discussed her other wrist splints at home and if they would better anchor her to reduce pain with wrist radial and ulnar deviation. Pt to try them and report back if it helped.    Person(s) Educated Patient    Methods Explanation;Demonstration;Handout  Comprehension Verbalized understanding;Returned demonstration            PT Short Term Goals - 06/14/20 1226      PT SHORT TERM GOAL #1   Title Pt will be Ind in an initial HEP/treatment program    Status On-going      PT SHORT TERM GOAL #2   Title pt will voice understanding of measures to reduce and manage pain    Status On-going      PT SHORT TERM GOAL #3   Title urinary leakage decreased >/= 25% due to improved mobility of pelvic floor so she is able to contract correctly    Status On-going      PT SHORT TERM GOAL #4   Title independent with abdominal massage to improve tissue mobitlity    Status On-going             PT Long Term Goals - 06/01/20 2330      PT LONG TERM GOAL #1   Title Pt will be Ind in a final HEP/treatment program to maintain or progress achieved level of function    Status New     Target Date 07/19/20      PT LONG TERM GOAL #2   Title Pt will report and improved level of R thumb pain to decreased frquency and level of pain of 4/10 or less    Baseline 8/10    Status New    Target Date 07/19/20      PT LONG TERM GOAL #3   Title Pt's R thumb ext at the [facility] jt will increase to 30d for improve functional use    Baseline neutral    Status New    Target Date 07/19/20      PT LONG TERM GOAL #4   Title Pt's FOTO score will improve to the predicted value of 65% functional ability    Baseline 51%    Status New    Target Date 07/19/20                 Plan - 06/14/20 1223    Clinical Impression Statement Pt continues to present with de Quervain's tendinitis of R thumb which has spread to digits 1-2 with moderate swelling of anatomical snuff box which responded to retrograde massage and muscle pump activation. PT/pt discussed other splint options as her current splint is not helping; she has multiple others at home and is to try them out functionally throughout the day. Today's presentation of pt's symptoms along with other splinting options were discussed with evaluating PT. Pt would benefit from further PT modalites for inflammation and pain reduction along with manual therapy and gentle hand flexibility and strengthening R. No increased pain after tx. Elimination of swelling noted.    PT Treatment/Interventions ADLs/Self Care Home Management;Cryotherapy;Electrical Stimulation;Contrast Bath;Ultrasound;Iontophoresis 4mg /ml Dexamethasone;Therapeutic exercise;Manual techniques;Patient/family education;Passive range of motion;Dry needling;Splinting;Taping;Joint Manipulations    PT Next Visit Plan see pt education section and review all with pt; continue [place] R hand if beneficial, continue treatment for R thumb pain    PT Home Exercise Plan Treatment plan in Education    Consulted and Agree with Plan of Care Patient           Patient will benefit from skilled therapeutic  intervention in order to improve the following deficits and impairments:  Impaired UE functional use,Decreased range of motion,Pain  Visit Diagnosis: De Quervain's tenosynovitis, right  Chronic pain of right thumb  Decreased range of motion of right thumb  Problem List Patient Active Problem List   Diagnosis Date Noted  . Fibroids 12/24/2013  . HYPERCHOLESTEROLEMIA 05/26/2006    Demond Shallenberger, PT 06/14/2020, 12:28 PM  [facility] [address] [place], [place], [address] Phone: [phone]   Fax:  [phone]  Name: Rebecca Allison MRN: [id number] Date of Birth: [birth year]

## 2020-06-20 ENCOUNTER — Other Ambulatory Visit: Payer: Self-pay

## 2020-06-20 ENCOUNTER — Ambulatory Visit: Payer: Managed Care, Other (non HMO)

## 2020-06-20 DIAGNOSIS — G8929 Other chronic pain: Secondary | ICD-10-CM

## 2020-06-20 DIAGNOSIS — M25641 Stiffness of right hand, not elsewhere classified: Secondary | ICD-10-CM

## 2020-06-20 DIAGNOSIS — M654 Radial styloid tenosynovitis [de Quervain]: Secondary | ICD-10-CM | POA: Diagnosis not present

## 2020-06-22 NOTE — Therapy (Signed)
[place], [place], [address] Phone: [phone]   Fax:  [phone]  Physical Therapy Treatment  Patient Details  Name: Rebecca Allison MRN: [id number] Date of Birth: [birth year] Referring Provider (PT): Pete Pelt, [place]   Encounter Date: 06/20/2020   PT End of Session - 06/22/20 1149    Visit Number 3    Number of Visits 7    Date for PT Re-Evaluation 07/19/20    Authorization Type [employer] MANAGED    PT Start Time 1219    PT Stop Time 1302    PT Time Calculation (min) 43 min    Activity Tolerance Patient tolerated treatment well;No increased pain    Behavior During Therapy WFL for tasks assessed/performed           Past Medical History:  Diagnosis Date  . Hyperlipidemia   . Hypertension   . Shortness of breath    when climbing over 3 flts stairs    Past Surgical History:  Procedure Laterality Date  . MYOMECTOMY N/A 12/24/2013   Procedure: MYOMECTOMY;  Surgeon: Thurnell Lose, MD;  Location: [facility];  Service: Gynecology;  Laterality: N/A;  . NO PAST SURGERIES      There were no vitals filed for this visit.   Subjective Assessment - 06/22/20 1142    Subjective Pt reports her R thumb is not doing better.    Limitations Walking;Other (comment)   typing   Patient Stated Goals For the pain to subside    Currently in Pain? Yes    Pain Score 8     Pain Location Wrist    Pain Orientation Right    Pain Descriptors / Indicators Sharp;Shooting    Pain Type Chronic pain    Pain Onset More than a month ago    Pain Frequency Intermittent    Aggravating Factors  typing, brushing teeth- any thumb movement    Pain Relieving Factors Rest                             [facility] Adult PT Treatment/Exercise - 06/22/20 0001      Manual Therapy   Manual Therapy Taping    Manual therapy comments KT taping of 1 inch width I strip from the R thumb PIP to lateral forearm c thumb and wrist  in neutral with strip layed down c the wrist in radial deviation. A 3 inch length I strip applied perpendicular to the other strip.    Soft tissue mobilization Cross friction massge to the thumb tendons of the lateral radial wrist form the DIP to distal to the R radial head for 5 mins. Myofascial release of the R thumbs tendons in thumb/wrist ext/rad dev moving to flexion/ulnar dev with pressure on the tendons from the DIP to distal to the radial head during that motion 5x, 2 sets. pt was instructed in this technique with pt returning demonstration.                  PT Education - 06/22/20 1145    Education Details To wear thumb/wrist splint at night. To lave the KT tape on until it begins to roll up. Complete cross friction massage 3x daily as tolerated. Use ice massage as needed. Complete active myofascial release for the R thumb 2x daily 5x after the KT tape has come off. Recommended using her L hand instead of the R with as many activites as  possible.    Person(s) Educated Patient    Methods Explanation;Demonstration;Tactile cues;Verbal cues    Comprehension Verbalized understanding;Verbal cues required;Returned demonstration;Tactile cues required;Need further instruction            PT Short Term Goals - 06/14/20 1226      PT SHORT TERM GOAL #1   Title Pt will be Ind in an initial HEP/treatment program    Status On-going      PT SHORT TERM GOAL #2   Title pt will voice understanding of measures to reduce and manage pain    Status On-going      PT SHORT TERM GOAL #3   Title urinary leakage decreased >/= 25% due to improved mobility of pelvic floor so she is able to contract correctly    Status On-going      PT SHORT TERM GOAL #4   Title independent with abdominal massage to improve tissue mobitlity    Status On-going             PT Long Term Goals - 06/01/20 2330      PT LONG TERM GOAL #1   Title Pt will be Ind in a final HEP/treatment program to maintain or  progress achieved level of function    Status New    Target Date 07/19/20      PT LONG TERM GOAL #2   Title Pt will report and improved level of R thumb pain to decreased frquency and level of pain of 4/10 or less    Baseline 8/10    Status New    Target Date 07/19/20      PT LONG TERM GOAL #3   Title Pt's R thumb ext at the [facility] jt will increase to 30d for improve functional use    Baseline neutral    Status New    Target Date 07/19/20      PT LONG TERM GOAL #4   Title Pt's FOTO score will improve to the predicted value of 65% functional ability    Baseline 51%    Status New    Target Date 07/19/20                 Plan - 06/22/20 1150    Clinical Impression Statement Pt continues to experience significant pain along the thumb tendons associated with chronic tendinitis. PT was provided to interupt the inflamatory process per cross friction massage and active myofascial release. At end of session KT taping was applied for support and pain management.    Personal Factors and Comorbidities Time since onset of injury/illness/exacerbation    Examination-Activity Limitations Other    Examination-Participation Restrictions Occupation    Stability/Clinical Decision Making Stable/Uncomplicated    Clinical Decision Making Low    Rehab Potential Fair    PT Frequency 1x / week    PT Duration 6 weeks    PT Treatment/Interventions ADLs/Self Care Home Management;Cryotherapy;Electrical Stimulation;Contrast Bath;Ultrasound;Iontophoresis 4mg /ml Dexamethasone;Therapeutic exercise;Manual techniques;Patient/family education;Passive range of motion;Dry needling;Splinting;Taping;Joint Manipulations    PT Next Visit Plan Assess pt's respnse to today's session    PT Home Exercise Plan Treatment plan in Education    Consulted and Agree with Plan of Care Patient           Patient will benefit from skilled therapeutic intervention in order to improve the following deficits and impairments:   Impaired UE functional use,Decreased range of motion,Pain  Visit Diagnosis: De Quervain's tenosynovitis, right  Chronic pain of right thumb  Decreased range of motion of  right thumb     Problem List Patient Active Problem List   Diagnosis Date Noted  . Fibroids 12/24/2013  . HYPERCHOLESTEROLEMIA 05/26/2006    Gar Ponto MS, PT 06/22/20 12:44 PM   [facility] [address] [place], [place], [address] Phone: [phone]   Fax:  [phone]  Name: MARKEL MERGENTHALER MRN: [id number] Date of Birth: [birth year]

## 2020-06-25 ENCOUNTER — Other Ambulatory Visit: Payer: Self-pay

## 2020-06-25 ENCOUNTER — Encounter: Payer: Self-pay | Admitting: Orthopaedic Surgery

## 2020-06-25 ENCOUNTER — Ambulatory Visit (INDEPENDENT_AMBULATORY_CARE_PROVIDER_SITE_OTHER): Payer: Managed Care, Other (non HMO) | Admitting: Orthopaedic Surgery

## 2020-06-25 DIAGNOSIS — M654 Radial styloid tenosynovitis [de Quervain]: Secondary | ICD-10-CM

## 2020-06-25 DIAGNOSIS — M25531 Pain in right wrist: Secondary | ICD-10-CM

## 2020-06-25 NOTE — Progress Notes (Signed)
The patient is a right-hand-dominant female who comes in with recurrent radial sided wrist pain.  We have diagnosed her in the past with right de Quervain's tenosynovitis.  She has had at least 2 steroid injections and one of them lasted more than 6 months or right at 6 months.  She does repetitive work and is right-hand dominant.  She does a lot of typing.  She denies any numbness and tingling.  On exam today her pain is still over the first dorsal compartment and the radial styloid of the right side.  We will still have to work with her FMLA on modifying work duties and times to be at work to rest her wrist.  At this point with the failure of 2 steroid injections around the first dorsal compartment, I would like to send her to a fellowship trained hand surgeon particular Dr. Burney Gauze or Fredna Dow at the [facility] for further evaluation and treatment of this significant de Quervain's tenosynovitis on a 35 year old that is right-hand dominant.

## 2020-06-27 ENCOUNTER — Ambulatory Visit: Payer: Managed Care, Other (non HMO) | Attending: Physician Assistant

## 2020-06-27 ENCOUNTER — Other Ambulatory Visit: Payer: Self-pay

## 2020-06-27 DIAGNOSIS — M79644 Pain in right finger(s): Secondary | ICD-10-CM | POA: Diagnosis present

## 2020-06-27 DIAGNOSIS — G8929 Other chronic pain: Secondary | ICD-10-CM

## 2020-06-27 DIAGNOSIS — M25641 Stiffness of right hand, not elsewhere classified: Secondary | ICD-10-CM

## 2020-06-27 DIAGNOSIS — M654 Radial styloid tenosynovitis [de Quervain]: Secondary | ICD-10-CM | POA: Diagnosis present

## 2020-06-28 NOTE — Therapy (Signed)
[place], [place], [address] Phone: [phone]   Fax:  [phone]  Physical Therapy Treatment  Patient Details  Name: Rebecca Allison MRN: [id number] Date of Birth: 02-27-1986 Referring Provider (PT): Pete Pelt, [place]   Encounter Date: 06/27/2020   PT End of Session - 06/28/20 0938    Visit Number 4    Number of Visits 7    Date for PT Re-Evaluation 07/19/20    Authorization Type [employer] MANAGED    PT Start Time 1226    PT Stop Time 1306    PT Time Calculation (min) 40 min    Activity Tolerance Patient tolerated treatment well;No increased pain    Behavior During Therapy WFL for tasks assessed/performed           Past Medical History:  Diagnosis Date  . Hyperlipidemia   . Hypertension   . Shortness of breath    when climbing over 3 flts stairs    Past Surgical History:  Procedure Laterality Date  . MYOMECTOMY N/A 12/24/2013   Procedure: MYOMECTOMY;  Surgeon: Thurnell Lose, MD;  Location: [facility];  Service: Gynecology;  Laterality: N/A;  . NO PAST SURGERIES      There were no vitals filed for this visit.   Subjective Assessment - 06/27/20 1229    Subjective Dr. Ninfa Linden is going to refer me to a hand specialist for an US guided injection    Limitations House hold activities   Typing, using R hand   Patient Stated Goals For the pain to subside    Currently in Pain? Yes    Pain Score 8     Pain Location Wrist    Pain Orientation Right    Pain Descriptors / Indicators Sharp;Shooting    Pain Type Chronic pain    Pain Onset More than a month ago    Pain Frequency Intermittent    Aggravating Factors  typing, brushing teeth- any thumb movement    Pain Relieving Factors Rest                             [facility] Adult PT Treatment/Exercise - 06/28/20 0001      Modalities   Modalities Iontophoresis      Iontophoresis   Type of Iontophoresis Dexamethasone    precautions to not get the patch wet and possible chance of skin reaction to adhesive   Location R radial wrist    Dose 1 ml; 4mg /ml    Time 6 hours      Manual Therapy   Manual Therapy Soft tissue mobilization    Soft tissue mobilization Retrograde massage to the R thumb and radial wrist extending proximally down the forearm due to swelling of the anatomical snuff and redial wrist area.               Cross friction massge to the thumb tendons of the lateral radial wrist form the DIP to distal to the R radial head for 5 mins. Myofascial release of the R thumbs tendons in thumb/wrist ext/rad dev moving to flexion/ulnar dev with pressure on the tendons from the DIP to distal to the radial head during that motion 10x 2 sets.                    PT Short Term Goals - 06/28/20 0947      PT SHORT TERM GOAL #1   Title Pt  will be Ind in an initial HEP/treatment program    Status Achieved    Target Date 06/27/20      PT SHORT TERM GOAL #2   Title pt will voice understanding of measures to reduce and manage pain    Status Achieved    Target Date 06/27/20             PT Long Term Goals - 06/01/20 2330      PT LONG TERM GOAL #1   Title Pt will be Ind in a final HEP/treatment program to maintain or progress achieved level of function    Status New    Target Date 07/19/20      PT LONG TERM GOAL #2   Title Pt will report and improved level of R thumb pain to decreased frquency and level of pain of 4/10 or less    Baseline 8/10    Status New    Target Date 07/19/20      PT LONG TERM GOAL #3   Title Pt's R thumb ext at the [facility] jt will increase to 30d for improve functional use    Baseline neutral    Status New    Target Date 07/19/20      PT LONG TERM GOAL #4   Title Pt's FOTO score will improve to the predicted value of 65% functional ability    Baseline 51%    Status New    Target Date 07/19/20                 Plan - 06/28/20 0946    Clinical Impression  Statement Pt presents with continued issues with R thumb and wrist tendinitis. Retrograde massage was applied to address swelling. CFM and myofascial release were completed for tissue remodeling. Iontophoresis was utilized to address inflamation.    Personal Factors and Comorbidities Time since onset of injury/illness/exacerbation    Examination-Activity Limitations Other    Examination-Participation Restrictions Occupation    Stability/Clinical Decision Making Stable/Uncomplicated    Rehab Potential Fair    PT Frequency 1x / week    PT Duration 6 weeks    PT Treatment/Interventions ADLs/Self Care Home Management;Cryotherapy;Electrical Stimulation;Contrast Bath;Ultrasound;Iontophoresis 4mg /ml Dexamethasone;Therapeutic exercise;Manual techniques;Patient/family education;Passive range of motion;Dry needling;Splinting;Taping;Joint Manipulations    PT Next Visit Plan Assess pt's response to iontophoresis    PT Home Exercise Plan Treatment plan in Education    Consulted and Agree with Plan of Care Patient           Patient will benefit from skilled therapeutic intervention in order to improve the following deficits and impairments:  Impaired UE functional use,Decreased range of motion,Pain  Visit Diagnosis: De Quervain's tenosynovitis, right  Chronic pain of right thumb  Decreased range of motion of right thumb     Problem List Patient Active Problem List   Diagnosis Date Noted  . Fibroids 12/24/2013  . HYPERCHOLESTEROLEMIA 05/26/2006    Gar Ponto MS, PT 06/28/20 12:53 PM  [facility] [address] [place], [place], [address] Phone: [phone]   Fax:  [phone]  Name: Rebecca Allison MRN: [id number] Date of Birth: 03-07-1986

## 2020-07-04 ENCOUNTER — Ambulatory Visit: Payer: Managed Care, Other (non HMO)

## 2020-07-04 ENCOUNTER — Other Ambulatory Visit: Payer: Self-pay

## 2020-07-04 DIAGNOSIS — M654 Radial styloid tenosynovitis [de Quervain]: Secondary | ICD-10-CM

## 2020-07-04 DIAGNOSIS — G8929 Other chronic pain: Secondary | ICD-10-CM

## 2020-07-04 DIAGNOSIS — M25641 Stiffness of right hand, not elsewhere classified: Secondary | ICD-10-CM

## 2020-07-04 NOTE — Therapy (Signed)
[place], [place], [address] Phone: [phone]   Fax:  [phone]  Physical Therapy Treatment  Patient Details  Name: Rebecca Allison MRN: [id number] Date of Birth: March 31, 1985 Referring Provider (PT): Pete Pelt, [place]   Encounter Date: 07/04/2020   PT End of Session - 07/04/20 1610    Visit Number 5    Number of Visits 7    Date for PT Re-Evaluation 07/19/20    Authorization Type [employer] MANAGED    PT Start Time 1216    PT Stop Time 1256    PT Time Calculation (min) 40 min    Activity Tolerance Patient tolerated treatment well;No increased pain    Behavior During Therapy WFL for tasks assessed/performed           Past Medical History:  Diagnosis Date  . Hyperlipidemia   . Hypertension   . Shortness of breath    when climbing over 3 flts stairs    Past Surgical History:  Procedure Laterality Date  . MYOMECTOMY N/A 12/24/2013   Procedure: MYOMECTOMY;  Surgeon: Thurnell Lose, MD;  Location: [facility];  Service: Gynecology;  Laterality: N/A;  . NO PAST SURGERIES      There were no vitals filed for this visit.   Subjective Assessment - 07/04/20 1220    Subjective Pt reports the pain seems to be spreading across te back of her hand. Pt is currently not in pain. Pt notes the iontophoresis lower her wrist pain for about a day.    Patient Stated Goals For the pain to subside    Currently in Pain? No/denies    Pain Score 8    pain is present more with active movement   Pain Location Wrist    Pain Orientation Right    Pain Descriptors / Indicators Sharp    Pain Type Chronic pain    Pain Onset More than a month ago    Pain Frequency Intermittent                             [facility] Adult PT Treatment/Exercise - 07/04/20 0001      Wrist Exercises   Wrist Flexion AROM;10 reps   closed hand   Wrist Flexion Limitations for wrist/finger extensor stretches, 5 sec       Modalities   Modalities Iontophoresis      Iontophoresis   Type of Iontophoresis Dexamethasone   precautions to not get the patch wet and possible chance of skin reaction to adhesive   Location R radial wrist    Dose 1 ml; 4mg /ml    Time 6 hours      Manual Therapy   Manual Therapy Soft tissue mobilization    Soft tissue mobilization Retrograde massage to the R thumb and radial wrist extending proximally down the forearm due to swelling of the anatomical snuff and redial wrist area.               Cross friction massge to the thumb tendons of the lateral radial wrist form the DIP to distal to the R radial head for 5 mins. Myofascial release of the R thumbs tendons in thumb/wrist ext/rad dev moving to flexion/ulnar dev with pressure on the tendons from the DIP to distal to the radial head during that motion 10x 2 sets.  PT Short Term Goals - 06/28/20 0947      PT SHORT TERM GOAL #1   Title Pt will be Ind in an initial HEP/treatment program    Status Achieved    Target Date 06/27/20      PT SHORT TERM GOAL #2   Title pt will voice understanding of measures to reduce and manage pain    Status Achieved    Target Date 06/27/20             PT Long Term Goals - 06/01/20 2330      PT LONG TERM GOAL #1   Title Pt will be Ind in a final HEP/treatment program to maintain or progress achieved level of function    Status New    Target Date 07/19/20      PT LONG TERM GOAL #2   Title Pt will report and improved level of R thumb pain to decreased frquency and level of pain of 4/10 or less    Baseline 8/10    Status New    Target Date 07/19/20      PT LONG TERM GOAL #3   Title Pt's R thumb ext at the [facility] jt will increase to 30d for improve functional use    Baseline neutral    Status New    Target Date 07/19/20      PT LONG TERM GOAL #4   Title Pt's FOTO score will improve to the predicted value of 65% functional ability    Baseline 51%    Status New     Target Date 07/19/20                 Plan - 07/04/20 1614    Clinical Impression Statement Pt presents with swelling of the posterior/radial proximal thumb and wrist area. Continued PT for retrograde massage, myofascial active release, and CFM. Swelling was decreased after session. Iontophorsesis was provided to address inflamation. Pt has an appt c a hand specialist 07/09/20. Currently, the pt's progress has been limited to temporary relief.           Patient will benefit from skilled therapeutic intervention in order to improve the following deficits and impairments:     Visit Diagnosis: De Quervain's tenosynovitis, right  Chronic pain of right thumb  Decreased range of motion of right thumb     Problem List Patient Active Problem List   Diagnosis Date Noted  . Fibroids 12/24/2013  . HYPERCHOLESTEROLEMIA 05/26/2006    Gar Ponto MS, PT 07/04/20 4:23 PM  [facility] [address] [place], [place], [address] Phone: [phone]   Fax:  [phone]  Name: Rebecca Allison MRN: [id number] Date of Birth: [birth year]

## 2020-07-09 ENCOUNTER — Encounter: Payer: Self-pay | Admitting: Rehabilitative and Restorative Service Providers"

## 2020-07-09 ENCOUNTER — Other Ambulatory Visit: Payer: Self-pay

## 2020-07-09 ENCOUNTER — Ambulatory Visit: Payer: Managed Care, Other (non HMO) | Admitting: Rehabilitative and Restorative Service Providers"

## 2020-07-09 DIAGNOSIS — M654 Radial styloid tenosynovitis [de Quervain]: Secondary | ICD-10-CM | POA: Diagnosis not present

## 2020-07-09 DIAGNOSIS — G8929 Other chronic pain: Secondary | ICD-10-CM

## 2020-07-09 DIAGNOSIS — M25641 Stiffness of right hand, not elsewhere classified: Secondary | ICD-10-CM

## 2020-07-09 NOTE — Patient Instructions (Signed)
Pt reports minimal improvement with PT services. AFter discussion, PT/pt discussed discharging from PT now and pt continuing HEP, retrograde massage, icing at home until surgery. She understands she can phone clinic at any time and ask questions.

## 2020-07-09 NOTE — Therapy (Addendum)
[place], [place], [address] Phone: [phone]   Fax:  [phone]  Physical Therapy Treatment/Discharge Summary  Patient Details  Name: Rebecca Allison MRN: [id number] Date of Birth: 1985-05-09 Referring Provider (PT): Pete Pelt, [place]   Encounter Date: 07/09/2020   PT End of Session - 07/09/20 1818    Visit Number 6    PT Start Time 0530    PT Stop Time 0615    PT Time Calculation (min) 45 min    Activity Tolerance Patient tolerated treatment well;No increased pain    Behavior During Therapy WFL for tasks assessed/performed           Past Medical History:  Diagnosis Date  . Hyperlipidemia   . Hypertension   . Shortness of breath    when climbing over 3 flts stairs    Past Surgical History:  Procedure Laterality Date  . MYOMECTOMY N/A 12/24/2013   Procedure: MYOMECTOMY;  Surgeon: Thurnell Lose, MD;  Location: [facility];  Service: Gynecology;  Laterality: N/A;  . NO PAST SURGERIES      There were no vitals filed for this visit.   Subjective Assessment - 07/09/20 1806    Subjective I am the same. Went to see the specialist today and they scheduled surgery for 08/11/2020; instead of there being one compartment here there are 2. that's the problem and that's why i am having the pain.    Currently in Pain? No/denies    Pain Score 0-No pain                             [facility] Adult PT Treatment/Exercise - 07/09/20 0001      Ultrasound   Ultrasound Parameters 50% 1.5w/cm2 x 8 min to R anatomical stuff box      Manual Therapy   Soft tissue mobilization Retrograde massage to the R thumb and radial wrist extending proximally down the forearm due to swelling of the anatomical snuff and redial wrist area.               Cross friction massge to the thumb tendons of the lateral radial wrist form the DIP to distal to the R radial head for 5 mins. Myofascial release of the R thumbs  tendons in thumb/wrist ext/rad dev moving to flexion/ulnar dev with pressure on the tendons from the DIP to distal to the radial head during that motion 10x 2 sets.                  PT Education - 07/09/20 1823    Education Details discussed surgery, types of potential immobility aides after surgery, continue therapy vs discharge by which pt/PT agreed discharge would be most appropriate at this time due to limited improvement with therapy services and upcoming surgery date. Pt reports no questions about retrograde massage, HEP or icing    Person(s) Educated Patient    Methods Explanation    Comprehension Verbalized understanding            PT Short Term Goals - 06/28/20 0947      PT SHORT TERM GOAL #1   Title Pt will be Ind in an initial HEP/treatment program    Status Achieved    Target Date 06/27/20      PT SHORT TERM GOAL #2   Title pt will voice understanding of measures to reduce and manage pain    Status Achieved  Target Date 06/27/20             PT Long Term Goals - 07/09/20 1753      PT LONG TERM GOAL #1   Title Pt will be Ind in a final HEP/treatment program to maintain or progress achieved level of function    Status Achieved      PT LONG TERM GOAL #2   Title Pt will report and improved level of R thumb pain to decreased frquency and level of pain of 4/10 or less    Status Not Met      PT LONG TERM GOAL #3   Title Pt's R thumb ext at the [facility] jt will increase to 30d for improve functional use    Status Not Met      PT LONG TERM GOAL #4   Title Pt's FOTO score will improve to the predicted value of 65% functional ability    Status Not Met                 Plan - 07/09/20 1818    Clinical Impression Statement Pt reports minimal improvement with PT services to address R wrist pain. She reports upcoming R wrist surgery scheduled today for next month. She is to continue HEP, retrograde massage and ice massage at home to address discomfort until  surgery date.She has no questions regarding techniques for HEP, retrograde massage or icing. DC    PT Treatment/Interventions ADLs/Self Care Home Management;Cryotherapy;Electrical Stimulation;Contrast Bath;Ultrasound;Iontophoresis 61m/ml Dexamethasone;Therapeutic exercise;Manual techniques;Patient/family education;Passive range of motion;Dry needling;Splinting;Taping;Joint Manipulations    Consulted and Agree with Plan of Care Patient           Patient will benefit from skilled therapeutic intervention in order to improve the following deficits and impairments:  Impaired UE functional use,Decreased range of motion,Pain  Visit Diagnosis: De Quervain's tenosynovitis, right  Chronic pain of right thumb  Decreased range of motion of right thumb     Problem List Patient Active Problem List   Diagnosis Date Noted  . Fibroids 12/24/2013  . HYPERCHOLESTEROLEMIA 05/26/2006    NAmerica Brown PT 07/09/2020, 6:25 PM  [facility] [address]Phone: [phone]  Fax:  [phone] Name: Rebecca LACOMBMRN: [id number] of Birth: [birth year]  PHYSICAL THERAPY DISCHARGE SUMMARY  Visits from Start of Care: 6  Current functional level related to goals / functional outcomes: See above   Remaining deficits: See above, pain   Education / Equipment: HEP, retrograde massage, icing Plan: Patient agrees to discharge.  Patient goals were partially met. Patient is being discharged due to lack of progress.  ?????      NAmerica Brown PT, DPT

## 2020-07-10 ENCOUNTER — Other Ambulatory Visit: Payer: Self-pay | Admitting: Orthopedic Surgery

## 2020-07-18 ENCOUNTER — Ambulatory Visit: Payer: Managed Care, Other (non HMO)

## 2020-08-04 ENCOUNTER — Other Ambulatory Visit: Payer: Self-pay

## 2020-08-04 ENCOUNTER — Encounter (HOSPITAL_BASED_OUTPATIENT_CLINIC_OR_DEPARTMENT_OTHER): Payer: Self-pay | Admitting: Orthopedic Surgery

## 2020-08-07 ENCOUNTER — Other Ambulatory Visit (HOSPITAL_COMMUNITY): Payer: Managed Care, Other (non HMO)

## 2020-08-08 ENCOUNTER — Other Ambulatory Visit (HOSPITAL_COMMUNITY)
Admission: RE | Admit: 2020-08-08 | Discharge: 2020-08-08 | Disposition: A | Discharge status: Home / Self Care | Payer: Managed Care, Other (non HMO) | Source: Ambulatory Visit | Attending: Orthopedic Surgery | Admitting: Orthopedic Surgery

## 2020-08-08 ENCOUNTER — Other Ambulatory Visit: Payer: Self-pay

## 2020-08-08 ENCOUNTER — Encounter (HOSPITAL_BASED_OUTPATIENT_CLINIC_OR_DEPARTMENT_OTHER)
Admission: RE | Admit: 2020-08-08 | Discharge: 2020-08-08 | Disposition: A | Discharge status: Home / Self Care | Payer: Managed Care, Other (non HMO) | Source: Ambulatory Visit | Attending: Orthopedic Surgery | Admitting: Orthopedic Surgery

## 2020-08-08 DIAGNOSIS — Z20822 Contact with and (suspected) exposure to covid-19: Secondary | ICD-10-CM | POA: Diagnosis not present

## 2020-08-08 DIAGNOSIS — Z01812 Encounter for preprocedural laboratory examination: Secondary | ICD-10-CM | POA: Insufficient documentation

## 2020-08-08 LAB — BASIC METABOLIC PANEL
Anion gap: 13 (ref 5–15)
BUN: 9 mg/dL (ref 6–20)
CO2: 21 mmol/L — ABNORMAL LOW (ref 22–32)
Calcium: 9.4 mg/dL (ref 8.9–10.3)
Chloride: 99 mmol/L (ref 98–111)
Creatinine, Ser: 0.97 mg/dL (ref 0.44–1.00)
GFR, Estimated: >60 mL/min (ref >60)
Glucose, Bld: 339 mg/dL — ABNORMAL HIGH (ref 70–99)
Potassium: 4.1 mmol/L (ref 3.5–5.1)
Sodium: 133 mmol/L — ABNORMAL LOW (ref 135–145)

## 2020-08-08 LAB — POCT PREGNANCY, URINE: Preg Test, Ur: NEGATIVE

## 2020-08-08 NOTE — Progress Notes (Signed)
Called pt, LM on VM, reminder to come for labs today.

## 2020-08-08 NOTE — Progress Notes (Signed)
Pt given G2 for eras protocol with instruction to complete DOS by 0945 or 2 hr prior to arrival at [facility]. Pt verbalized understanding       Enhanced Recovery after Surgery for Orthopedics Enhanced Recovery after Surgery is a protocol used to improve the stress on your body and your recovery after surgery.  Patient Instructions  . The night before surgery:  o No food after midnight. ONLY clear liquids after midnight  . The day of surgery (if you do NOT have diabetes):  o Drink ONE (1) Pre-Surgery Clear Ensure as directed.   o This drink was given to you during your hospital  pre-op appointment visit. o The pre-op nurse will instruct you on the time to drink the  Pre-Surgery Ensure depending on your surgery time. o Finish the drink at the designated time by the pre-op nurse.  o Nothing else to drink after completing the  Pre-Surgery Clear Ensure.  . The day of surgery (if you have diabetes): o Drink ONE (1) Gatorade 2 (G2) as directed. o This drink was given to you during your hospital  pre-op appointment visit.  o The pre-op nurse will instruct you on the time to drink the   Gatorade 2 (G2) depending on your surgery time. o Color of the Gatorade may vary. Red is not allowed. o Nothing else to drink after completing the  Gatorade 2 (G2).         If you have questions, please contact your surgeon's office.

## 2020-08-08 NOTE — Progress Notes (Signed)
Reviewed lab work (gluc) with Dr Daiva Huge (anesthesia) who said to cancel case and have patient follow up with her primary MD. Damaris Schooner with [place] at Dr Marcelyn Bruins office who will notify patient.

## 2020-08-09 LAB — SARS CORONAVIRUS 2 (TAT 6-24 HRS): SARS Coronavirus 2: NEGATIVE

## 2020-08-11 ENCOUNTER — Ambulatory Visit (HOSPITAL_BASED_OUTPATIENT_CLINIC_OR_DEPARTMENT_OTHER)
Admission: RE | Admit: 2020-08-11 | Payer: Managed Care, Other (non HMO) | Source: Ambulatory Visit | Admitting: Orthopedic Surgery

## 2020-08-11 HISTORY — DX: Type 2 diabetes mellitus without complications: E11.9

## 2020-08-11 SURGERY — RELEASE, FIRST DORSAL COMPARTMENT, HAND
Anesthesia: Choice | Laterality: Right

## 2020-09-24 ENCOUNTER — Other Ambulatory Visit: Payer: Self-pay | Admitting: Orthopedic Surgery

## 2020-09-30 ENCOUNTER — Other Ambulatory Visit: Payer: Self-pay

## 2020-09-30 ENCOUNTER — Ambulatory Visit: Payer: Managed Care, Other (non HMO) | Admitting: Interventional Cardiology

## 2020-09-30 VITALS — BP 128/74 | HR 89 | Ht 61.0 in | Wt 214.4 lb

## 2020-09-30 DIAGNOSIS — R002 Palpitations: Secondary | ICD-10-CM | POA: Diagnosis not present

## 2020-09-30 DIAGNOSIS — E119 Type 2 diabetes mellitus without complications: Secondary | ICD-10-CM | POA: Diagnosis not present

## 2020-09-30 DIAGNOSIS — I10 Essential (primary) hypertension: Secondary | ICD-10-CM | POA: Diagnosis not present

## 2020-09-30 NOTE — Progress Notes (Signed)
Cardiology Office Note   Date:  09/30/2020   ID:  [name], [name] [birth year], MRN [id number]  PCP:  Carol Ada, MD    No chief complaint on file.  palpitations  Wt Readings from Last 3 Encounters:  09/30/20 214 lb 6.4 oz (97.3 kg)  01/11/17 228 lb (103.4 kg)  12/24/13 216 lb (98 kg)       History of Present Illness: Rebecca Allison is a 35 y.o. female who is being seen today for the evaluation of palpitations at the request of Carol Ada, MD.   She had COVID in 04/2020.  Immediately after, she had a lot of palpitations.  Symptoms would last 5 minutes.  Her symptoms were basically every night.  6 weeks later, symptoms started to decrease.   Denies : Chest pain. Dizziness. Leg edema. Nitroglycerin use. Orthopnea. Palpitations. Paroxysmal nocturnal dyspnea. Shortness of breath. Syncope.    No regular exercise.  Walking short distances only.     Past Medical History:  Diagnosis Date   Acanthosis nigricans    Chlamydia    Diabetes mellitus without complication ([place])    Dysmenorrhea    Fibroid    Herpes simplex vulvovaginitis    High-tone pelvic floor dysfunction    HSV-2 infection    Hyperlipidemia    Hyperlipidemia, unspecified    Hypertension    Leiomyoma of uterus, unspecified    Obesity    Other obesity due to excess calories    Palpitation    Pleural effusion    Shortness of breath    when climbing over 3 flts stairs   Tachycardia    Tendonitis    Type II diabetes mellitus ([place])     Past Surgical History:  Procedure Laterality Date   MYOMECTOMY N/A 12/24/2013   Procedure: MYOMECTOMY;  Surgeon: Thurnell Lose, MD;  Location: [facility];  Service: Gynecology;  Laterality: N/A;   NO PAST SURGERIES       Current Outpatient Medications  Medication Sig Dispense Refill   acetaminophen (TYLENOL) 325 MG tablet Take 2 tablets (650 mg total) by mouth every 6 (six) hours as needed.     Biotin 10 MG CAPS Take by mouth.     cyclobenzaprine (FLEXERIL) 5  MG tablet Take 2 tablets (10 mg total) by mouth 3 (three) times daily as needed for muscle spasms. 12 tablet 0   diclofenac sodium (VOLTAREN) 1 % GEL Apply 2 g topically 4 (four) times daily. 1 Tube 1   DROSPIRENONE PO Take 4 mg by mouth.     gabapentin (NEURONTIN) 100 MG capsule Take 100 mg by mouth 3 (three) times daily.     HYDROCHLOROTHIAZIDE PO Take by mouth.     ibuprofen (ADVIL,MOTRIN) 600 MG tablet Take 1 tablet (600 mg total) by mouth every 6 (six) hours as needed (mild pain). 30 tablet 1   lisinopril (ZESTRIL) 2.5 MG tablet Take 2.5 mg by mouth daily.     METFORMIN HCL PO Take by mouth.     norethindrone (MICRONOR) 0.35 MG tablet Take 1 tablet by mouth daily.     Prenatal Vit-Fe Fumarate-FA (M-VIT PO) Take by mouth.     ROSUVASTATIN CALCIUM PO Take by mouth.     valACYclovir (VALTREX) 500 MG tablet Take 500 mg by mouth 2 (two) times daily.     No current facility-administered medications for this visit.    Allergies:   Metronidazole    Social History:  The patient  reports that she has never  smoked. She has never used smokeless tobacco. She reports that she does not drink alcohol and does not use drugs.   Family History:  The patient's family history includes Diabetes in her maternal grandmother; Hyperlipidemia in her maternal grandmother; Hypertension in her maternal grandmother and mother.    ROS:  Please see the history of present illness.   Otherwise, review of systems are positive for lack of exercise.   All other systems are reviewed and negative.    PHYSICAL EXAM: VS:  BP 128/74   Pulse 89   Ht 5\' 1"  (1.549 m)   Wt 214 lb 6.4 oz (97.3 kg)   BMI 40.51 kg/m  , BMI Body mass index is 40.51 kg/m. GEN: Well nourished, well developed, in no acute distress HEENT: normal Neck: no JVD, carotid bruits, or masses Cardiac: RRR; no murmurs, rubs, or gallops,no edema  Respiratory:  clear to auscultation bilaterally, normal work of breathing GI: soft, nontender,  nondistended, + BS, obese MS: no deformity or atrophy Skin: warm and dry, no rash Neuro:  Strength and sensation are intact Psych: euthymic mood, full affect   EKG:   The ekg ordered today demonstrates NSR, no ST changes   Recent Labs: 08/08/2020: BUN 9; Creatinine, Ser 0.97; Potassium 4.1; Sodium 133   Lipid Panel No results found for: CHOL, TRIG, HDL, CHOLHDL, VLDL, LDLCALC, LDLDIRECT   Other studies Reviewed: Additional studies/ records that were reviewed today with results demonstrating: labs reviewed.   ASSESSMENT AND PLAN:  Palpitations: Sx have decreased.  If palpitations return, she will let us know and we can order a monitor.  Monitor at this point would be very low yield given her lack of symptoms.  No syncope.  No evidence of heart failure on exam.  I think her palpitations were low risk. HTN: The current medical regimen is effective;  continue present plan and medications. DM: Dietary suggestions as noted below.  A1C 10.6 in 08/2020. Increase exercise. Insulin was recently started.  Morbid obesity: Avoiding processed foods.  Eating whole food, plant-based diet. Hyperlipidemia: rosuvastatin.  LDL 113.  WOuld like to see it below 100.  Lifestyle changes will help get your cholesterol down.   Current medicines are reviewed at length with the patient today.  The patient concerns regarding her medicines were addressed.  The following changes have been made:  No change  Labs/ tests ordered today include:  No orders of the defined types were placed in this encounter.   Recommend 150 minutes/week of aerobic exercise Low fat, low carb, high fiber diet recommended  Disposition:   FU as needed.     Signed, Larae Grooms, MD  09/30/2020 3:56 PM    [facility] [place], [place], [place]  [address] Phone: [phone]; Fax: [phone]

## 2020-09-30 NOTE — Patient Instructions (Signed)
Medication Instructions:  Your physician recommends that you continue on your current medications as directed. Please refer to the Current Medication list given to you today.  *If you need a refill on your cardiac medications before your next appointment, please call your pharmacy*   Lab Work: none If you have labs (blood work) drawn today and your tests are completely normal, you will receive your results only by: [place] (if you have MyChart) OR A paper copy in the mail If you have any lab test that is abnormal or we need to change your treatment, we will call you to review the results.   Testing/Procedures: none   Follow-Up: At [facility], you and your health needs are our priority.  As part of our continuing mission to provide you with exceptional heart care, we have created designated Provider Care Teams.  These Care Teams include your primary Cardiologist (physician) and Advanced Practice Providers (APPs -  Physician Assistants and Nurse Practitioners) who all work together to provide you with the care you need, when you need it.  We recommend signing up for the patient portal called "MyChart".  Sign up information is provided on this After Visit Summary.  MyChart is used to connect with patients for Virtual Visits (Telemedicine).  Patients are able to view lab/test results, encounter notes, upcoming appointments, etc.  Non-urgent messages can be sent to your provider as well.   To learn more about what you can do with MyChart, go to [email or web address].    Your next appointment:   As needed  The format for your next appointment:   In Person  Provider:   You may see Larae Grooms, MD or one of the following Advanced Practice Providers on your designated Care Team:   Melina Copa, PA-C Ermalinda Barrios, PA-C   Other Instructions High-Fiber Eating Plan Fiber, also called dietary fiber, is a type of carbohydrate. It is found foods such as fruits, vegetables,  whole grains, and beans. A high-fiber diet can have many health benefits. Your health care provider may recommend a high-fiber diet to help: Prevent constipation. Fiber can make your bowel movements more regular. Lower your cholesterol. Relieve the following conditions: Inflammation of veins in the anus (hemorrhoids). Inflammation of specific areas of the digestive tract (uncomplicated diverticulosis). A problem of the large intestine, also called the colon, that sometimes causes pain and diarrhea (irritable bowel syndrome, or IBS). Prevent overeating as part of a weight-loss plan. Prevent heart disease, type 2 diabetes, and certain cancers. What are tips for following this plan? Reading food labels  Check the nutrition facts label on food products for the amount of dietary fiber. Choose foods that have 5 grams of fiber or more per serving. The goals for recommended daily fiber intake include: Men (age 106 or younger): 34-38 g. Men (over age 39): 28-34 g. Women (age 25 or younger): 25-28 g. Women (over age 56): 22-25 g. Your daily fiber goal is _____________ g. Shopping Choose whole fruits and vegetables instead of processed forms, such as apple juice or applesauce. Choose a wide variety of high-fiber foods such as avocados, lentils, oats, and kidney beans. Read the nutrition facts label of the foods you choose. Be aware of foods with added fiber. These foods often have high sugar and sodium amounts per serving. Cooking Use whole-grain flour for baking and cooking. Cook with brown rice instead of white rice. Meal planning Start the day with a breakfast that is high in fiber, such as a cereal that  contains 5 g of fiber or more per serving. Eat breads and cereals that are made with whole-grain flour instead of refined flour or white flour. Eat brown rice, bulgur wheat, or millet instead of white rice. Use beans in place of meat in soups, salads, and pasta dishes. Be sure that half of the  grains you eat each day are whole grains. General information You can get the recommended daily intake of dietary fiber by: Eating a variety of fruits, vegetables, grains, nuts, and beans. Taking a fiber supplement if you are not able to take in enough fiber in your diet. It is better to get fiber through food than from a supplement. Gradually increase how much fiber you consume. If you increase your intake of dietary fiber too quickly, you may have bloating, cramping, or gas. Drink plenty of water to help you digest fiber. Choose high-fiber snacks, such as berries, raw vegetables, nuts, and popcorn. What foods should I eat? Fruits Berries. Pears. Apples. Oranges. Avocado. Prunes and raisins. Dried figs. Vegetables Sweet potatoes. Spinach. Kale. Artichokes. Cabbage. Broccoli. Cauliflower.Green peas. Carrots. Squash. Grains Whole-grain breads. Multigrain cereal. Oats and oatmeal. Brown rice. Barley.Bulgur wheat. [place]. Quinoa. Bran muffins. Popcorn. Rye wafer crackers. Meats and other proteins Navy beans, kidney beans, and pinto beans. Soybeans. Split peas. Lentils. Nutsand seeds. Dairy Fiber-fortified yogurt. Beverages Fiber-fortified soy milk. Fiber-fortified orange juice. Other foods Fiber bars. The items listed above may not be a complete list of recommended foods and beverages. Contact a dietitian for more information. What foods should I avoid? Fruits Fruit juice. Cooked, strained fruit. Vegetables Fried potatoes. Canned vegetables. Well-cooked vegetables. Grains White bread. Pasta made with refined flour. White rice. Meats and other proteins Fatty cuts of meat. Fried chicken or fried fish. Dairy Milk. Yogurt. Cream cheese. Sour cream. Fats and oils Butters. Beverages Soft drinks. Other foods Cakes and pastries. The items listed above may not be a complete list of foods and beverages to avoid. Talk with your dietitian about what choices are best for you. Summary Fiber  is a type of carbohydrate. It is found in foods such as fruits, vegetables, whole grains, and beans. A high-fiber diet has many benefits. It can help to prevent constipation, lower blood cholesterol, aid weight loss, and reduce your risk of heart disease, diabetes, and certain cancers. Increase your intake of fiber gradually. Increasing fiber too quickly may cause cramping, bloating, and gas. Drink plenty of water while you increase the amount of fiber you consume. The best sources of fiber include whole fruits and vegetables, whole grains, nuts, seeds, and beans. This information is not intended to replace advice given to you by your health care provider. Make sure you discuss any questions you have with your healthcare provider. Document Revised: 07/19/2019 Document Reviewed: 07/19/2019 Elsevier Patient Education  2022 [employer].

## 2020-10-29 ENCOUNTER — Encounter (HOSPITAL_BASED_OUTPATIENT_CLINIC_OR_DEPARTMENT_OTHER): Payer: Self-pay | Admitting: Orthopedic Surgery

## 2020-10-29 ENCOUNTER — Other Ambulatory Visit: Payer: Self-pay

## 2020-11-04 ENCOUNTER — Encounter (HOSPITAL_BASED_OUTPATIENT_CLINIC_OR_DEPARTMENT_OTHER)
Admission: RE | Admit: 2020-11-04 | Discharge: 2020-11-04 | Disposition: A | Discharge status: Home / Self Care | Payer: Managed Care, Other (non HMO) | Source: Ambulatory Visit | Attending: Orthopedic Surgery | Admitting: Orthopedic Surgery

## 2020-11-04 DIAGNOSIS — Z8249 Family history of ischemic heart disease and other diseases of the circulatory system: Secondary | ICD-10-CM | POA: Diagnosis not present

## 2020-11-04 DIAGNOSIS — Z833 Family history of diabetes mellitus: Secondary | ICD-10-CM | POA: Diagnosis not present

## 2020-11-04 DIAGNOSIS — Z888 Allergy status to other drugs, medicaments and biological substances status: Secondary | ICD-10-CM | POA: Diagnosis not present

## 2020-11-04 DIAGNOSIS — E119 Type 2 diabetes mellitus without complications: Secondary | ICD-10-CM | POA: Diagnosis not present

## 2020-11-04 DIAGNOSIS — E785 Hyperlipidemia, unspecified: Secondary | ICD-10-CM | POA: Diagnosis not present

## 2020-11-04 DIAGNOSIS — Z6839 Body mass index (BMI) 39.0-39.9, adult: Secondary | ICD-10-CM | POA: Diagnosis not present

## 2020-11-04 DIAGNOSIS — Z8619 Personal history of other infectious and parasitic diseases: Secondary | ICD-10-CM | POA: Diagnosis not present

## 2020-11-04 DIAGNOSIS — Z881 Allergy status to other antibiotic agents status: Secondary | ICD-10-CM | POA: Diagnosis not present

## 2020-11-04 DIAGNOSIS — M654 Radial styloid tenosynovitis [de Quervain]: Secondary | ICD-10-CM | POA: Diagnosis not present

## 2020-11-04 DIAGNOSIS — I10 Essential (primary) hypertension: Secondary | ICD-10-CM | POA: Diagnosis not present

## 2020-11-04 DIAGNOSIS — E669 Obesity, unspecified: Secondary | ICD-10-CM | POA: Diagnosis not present

## 2020-11-04 LAB — BASIC METABOLIC PANEL
Anion gap: 10 (ref 5–15)
BUN: 11 mg/dL (ref 6–20)
CO2: 22 mmol/L (ref 22–32)
Calcium: 9.8 mg/dL (ref 8.9–10.3)
Chloride: 106 mmol/L (ref 98–111)
Creatinine, Ser: 0.92 mg/dL (ref 0.44–1.00)
GFR, Estimated: >60 mL/min (ref >60)
Glucose, Bld: 99 mg/dL (ref 70–99)
Potassium: 4.7 mmol/L (ref 3.5–5.1)
Sodium: 138 mmol/L (ref 135–145)

## 2020-11-05 MED ORDER — LACTATED RINGERS IV SOLN: INTRAVENOUS | Status: DC

## 2020-11-05 MED ORDER — CEFAZOLIN SODIUM-DEXTROSE 2-4 GM/100ML-% IV SOLN: 2.0000 g | INTRAVENOUS | Status: AC

## 2020-11-06 ENCOUNTER — Other Ambulatory Visit: Payer: Self-pay

## 2020-11-06 ENCOUNTER — Encounter (HOSPITAL_BASED_OUTPATIENT_CLINIC_OR_DEPARTMENT_OTHER): Payer: Self-pay | Admitting: Orthopedic Surgery

## 2020-11-06 ENCOUNTER — Ambulatory Visit (HOSPITAL_BASED_OUTPATIENT_CLINIC_OR_DEPARTMENT_OTHER): Payer: Managed Care, Other (non HMO) | Admitting: Anesthesiology

## 2020-11-06 ENCOUNTER — Ambulatory Visit (HOSPITAL_BASED_OUTPATIENT_CLINIC_OR_DEPARTMENT_OTHER)
Admission: RE | Admit: 2020-11-06 | Discharge: 2020-11-06 | Disposition: A | Discharge status: Home / Self Care | Payer: Managed Care, Other (non HMO) | Attending: Orthopedic Surgery | Admitting: Orthopedic Surgery

## 2020-11-06 ENCOUNTER — Encounter (HOSPITAL_BASED_OUTPATIENT_CLINIC_OR_DEPARTMENT_OTHER)
Admission: RE | Disposition: A | Discharge status: Home / Self Care | Payer: Self-pay | Source: Home / Self Care | Attending: Orthopedic Surgery

## 2020-11-06 DIAGNOSIS — Z888 Allergy status to other drugs, medicaments and biological substances status: Secondary | ICD-10-CM | POA: Insufficient documentation

## 2020-11-06 DIAGNOSIS — Z8249 Family history of ischemic heart disease and other diseases of the circulatory system: Secondary | ICD-10-CM | POA: Insufficient documentation

## 2020-11-06 DIAGNOSIS — Z833 Family history of diabetes mellitus: Secondary | ICD-10-CM | POA: Insufficient documentation

## 2020-11-06 DIAGNOSIS — E785 Hyperlipidemia, unspecified: Secondary | ICD-10-CM | POA: Insufficient documentation

## 2020-11-06 DIAGNOSIS — Z881 Allergy status to other antibiotic agents status: Secondary | ICD-10-CM | POA: Insufficient documentation

## 2020-11-06 DIAGNOSIS — Z6839 Body mass index (BMI) 39.0-39.9, adult: Secondary | ICD-10-CM | POA: Insufficient documentation

## 2020-11-06 DIAGNOSIS — E669 Obesity, unspecified: Secondary | ICD-10-CM | POA: Insufficient documentation

## 2020-11-06 DIAGNOSIS — Z8619 Personal history of other infectious and parasitic diseases: Secondary | ICD-10-CM | POA: Insufficient documentation

## 2020-11-06 DIAGNOSIS — M654 Radial styloid tenosynovitis [de Quervain]: Secondary | ICD-10-CM | POA: Diagnosis not present

## 2020-11-06 DIAGNOSIS — I10 Essential (primary) hypertension: Secondary | ICD-10-CM | POA: Insufficient documentation

## 2020-11-06 DIAGNOSIS — E119 Type 2 diabetes mellitus without complications: Secondary | ICD-10-CM | POA: Insufficient documentation

## 2020-11-06 HISTORY — PX: DORSAL COMPARTMENT RELEASE: SHX5039

## 2020-11-06 HISTORY — DX: Personal history of diseases of the skin and subcutaneous tissue: Z87.2

## 2020-11-06 LAB — GLUCOSE, CAPILLARY
Glucose-Capillary: 115 mg/dL — ABNORMAL HIGH (ref 70–99)
Glucose-Capillary: 121 mg/dL — ABNORMAL HIGH (ref 70–99)

## 2020-11-06 LAB — POCT PREGNANCY, URINE: Preg Test, Ur: NEGATIVE

## 2020-11-06 SURGERY — RELEASE, FIRST DORSAL COMPARTMENT, HAND
Anesthesia: General | Site: Wrist | Laterality: Right

## 2020-11-06 MED ORDER — CEFAZOLIN SODIUM-DEXTROSE 2-4 GM/100ML-% IV SOLN
INTRAVENOUS | Status: AC
  Filled 2020-11-06: qty 100

## 2020-11-06 MED ORDER — MIDAZOLAM HCL 2 MG/2ML IJ SOLN
INTRAMUSCULAR | Status: AC
  Filled 2020-11-06: qty 2

## 2020-11-06 MED ORDER — ONDANSETRON HCL 4 MG/2ML IJ SOLN: 4.0000 mg | Freq: Once | INTRAMUSCULAR | Status: DC | PRN

## 2020-11-06 MED ORDER — DEXAMETHASONE SODIUM PHOSPHATE 10 MG/ML IJ SOLN
INTRAMUSCULAR | Status: DC | PRN
  Administered 2020-11-06: 4 mg via INTRAVENOUS

## 2020-11-06 MED ORDER — CEFAZOLIN SODIUM-DEXTROSE 2-3 GM-%(50ML) IV SOLR
INTRAVENOUS | Status: DC | PRN
  Administered 2020-11-06: 2 g via INTRAVENOUS

## 2020-11-06 MED ORDER — OXYCODONE HCL 5 MG PO TABS: 5.0000 mg | ORAL_TABLET | Freq: Once | ORAL | Status: DC | PRN

## 2020-11-06 MED ORDER — MIDAZOLAM HCL 5 MG/5ML IJ SOLN
INTRAMUSCULAR | Status: DC | PRN
  Administered 2020-11-06: 2 mg via INTRAVENOUS

## 2020-11-06 MED ORDER — ONDANSETRON HCL 4 MG/2ML IJ SOLN
INTRAMUSCULAR | Status: AC
  Filled 2020-11-06: qty 2

## 2020-11-06 MED ORDER — LIDOCAINE 2% (20 MG/ML) 5 ML SYRINGE
INTRAMUSCULAR | Status: DC | PRN
  Administered 2020-11-06: 80 mg via INTRAVENOUS

## 2020-11-06 MED ORDER — OXYCODONE HCL 5 MG/5ML PO SOLN
5.0000 mg | Freq: Once | ORAL | Status: DC | PRN
Start: 2020-11-06 — End: 2020-11-06

## 2020-11-06 MED ORDER — FENTANYL CITRATE (PF) 100 MCG/2ML IJ SOLN
INTRAMUSCULAR | Status: AC
  Filled 2020-11-06: qty 2

## 2020-11-06 MED ORDER — PROPOFOL 10 MG/ML IV BOLUS
INTRAVENOUS | Status: AC
  Filled 2020-11-06: qty 20

## 2020-11-06 MED ORDER — FENTANYL CITRATE (PF) 100 MCG/2ML IJ SOLN
25.0000 ug | INTRAMUSCULAR | Status: DC | PRN
  Administered 2020-11-06 (×3): 50 ug via INTRAVENOUS

## 2020-11-06 MED ORDER — HYDROCODONE-ACETAMINOPHEN 5-325 MG PO TABS: ORAL_TABLET | ORAL | 0 refills | Status: AC

## 2020-11-06 MED ORDER — FENTANYL CITRATE (PF) 100 MCG/2ML IJ SOLN
INTRAMUSCULAR | Status: DC | PRN
  Administered 2020-11-06: 25 ug via INTRAVENOUS
  Administered 2020-11-06: 50 ug via INTRAVENOUS
  Administered 2020-11-06: 25 ug via INTRAVENOUS

## 2020-11-06 MED ORDER — LIDOCAINE HCL (PF) 2 % IJ SOLN
INTRAMUSCULAR | Status: AC
  Filled 2020-11-06: qty 5

## 2020-11-06 MED ORDER — DEXAMETHASONE SODIUM PHOSPHATE 10 MG/ML IJ SOLN
INTRAMUSCULAR | Status: AC
  Filled 2020-11-06: qty 1

## 2020-11-06 MED ORDER — BUPIVACAINE HCL (PF) 0.25 % IJ SOLN
INTRAMUSCULAR | Status: DC | PRN
  Administered 2020-11-06: 9 mL

## 2020-11-06 MED ORDER — ONDANSETRON HCL 4 MG/2ML IJ SOLN
INTRAMUSCULAR | Status: DC | PRN
  Administered 2020-11-06: 4 mg via INTRAVENOUS

## 2020-11-06 MED ORDER — PROPOFOL 10 MG/ML IV BOLUS
INTRAVENOUS | Status: DC | PRN
  Administered 2020-11-06: 200 mg via INTRAVENOUS

## 2020-11-06 SURGICAL SUPPLY — 36 items
APL PRP STRL LF DISP 70% ISPRP (MISCELLANEOUS) ×1
BLADE MINI RND TIP GREEN BEAV (BLADE) IMPLANT
BLADE SURG 15 STRL LF DISP TIS (BLADE) ×2 IMPLANT
BLADE SURG 15 STRL SS (BLADE) ×4
BNDG CMPR 9X4 STRL LF SNTH (GAUZE/BANDAGES/DRESSINGS) ×1
BNDG ELASTIC 3X5.8 VLCR STR LF (GAUZE/BANDAGES/DRESSINGS) IMPLANT
BNDG ESMARK 4X9 LF (GAUZE/BANDAGES/DRESSINGS) ×1 IMPLANT
BNDG GAUZE ELAST 4 BULKY (GAUZE/BANDAGES/DRESSINGS) ×2 IMPLANT
CHLORAPREP W/TINT 26 (MISCELLANEOUS) ×2 IMPLANT
CORD BIPOLAR FORCEPS 12FT (ELECTRODE) ×2 IMPLANT
COVER BACK TABLE 60X90IN (DRAPES) ×2 IMPLANT
COVER MAYO STAND STRL (DRAPES) ×2 IMPLANT
CUFF TOURN SGL QUICK 18X4 (TOURNIQUET CUFF) ×2 IMPLANT
DRAPE EXTREMITY T 121X128X90 (DISPOSABLE) ×2 IMPLANT
DRAPE SURG 17X23 STRL (DRAPES) ×2 IMPLANT
DRSG PAD ABDOMINAL 8X10 ST (GAUZE/BANDAGES/DRESSINGS) ×2 IMPLANT
GAUZE SPONGE 4X4 12PLY STRL (GAUZE/BANDAGES/DRESSINGS) ×2 IMPLANT
GAUZE XEROFORM 1X8 LF (GAUZE/BANDAGES/DRESSINGS) ×2 IMPLANT
GLOVE SRG 8 PF TXTR STRL LF DI (GLOVE) ×1 IMPLANT
GLOVE SURG ENC MOIS LTX SZ7.5 (GLOVE) ×2 IMPLANT
GLOVE SURG UNDER POLY LF SZ8 (GLOVE) ×2
GOWN STRL REUS W/ TWL LRG LVL3 (GOWN DISPOSABLE) ×1 IMPLANT
GOWN STRL REUS W/TWL LRG LVL3 (GOWN DISPOSABLE) ×2
GOWN STRL REUS W/TWL XL LVL3 (GOWN DISPOSABLE) ×2 IMPLANT
NDL HYPO 25X1 1.5 SAFETY (NEEDLE) ×1 IMPLANT
NEEDLE HYPO 25X1 1.5 SAFETY (NEEDLE) ×2 IMPLANT
NS IRRIG 1000ML POUR BTL (IV SOLUTION) ×2 IMPLANT
PACK BASIN DAY SURGERY FS (CUSTOM PROCEDURE TRAY) ×2 IMPLANT
PADDING CAST ABS 4INX4YD NS (CAST SUPPLIES) ×1
PADDING CAST ABS COTTON 4X4 ST (CAST SUPPLIES) ×1 IMPLANT
STOCKINETTE 4X48 STRL (DRAPES) ×2 IMPLANT
SUT ETHILON 4 0 PS 2 18 (SUTURE) ×2 IMPLANT
SYR BULB EAR ULCER 3OZ GRN STR (SYRINGE) ×2 IMPLANT
SYR CONTROL 10ML LL (SYRINGE) ×2 IMPLANT
TOWEL GREEN STERILE FF (TOWEL DISPOSABLE) ×2 IMPLANT
UNDERPAD 30X36 HEAVY ABSORB (UNDERPADS AND DIAPERS) ×2 IMPLANT

## 2020-11-06 NOTE — Discharge Instructions (Addendum)

## 2020-11-06 NOTE — Transfer of Care (Signed)
Immediate Anesthesia Transfer of Care Note  Patient: Rebecca Allison  Procedure(s) Performed: RIGHT FIRST DORSAL COMPARTMENT RELEASE (DEQUERVAIN) (Right: Wrist)  Patient Location: PACU  Anesthesia Type:General  Level of Consciousness: awake, alert  and oriented  Airway & Oxygen Therapy: Patient Spontanous Breathing and Patient connected to face mask oxygen  Post-op Assessment: Report given to RN and Post -op Vital signs reviewed and stable  Post vital signs: Reviewed and stable  Last Vitals:  Vitals Value Taken Time  BP 123/97 11/06/20 1145  Temp    Pulse 113 11/06/20 1145  Resp 22 11/06/20 1145  SpO2 100 % 11/06/20 1145  Vitals shown include unvalidated device data.  Last Pain:  Vitals:   11/06/20 0946  TempSrc: Oral  PainSc: 7       Patients Stated Pain Goal: 3 ([id number])  Complications: No notable events documented.

## 2020-11-06 NOTE — H&P (Signed)
Rebecca Allison is an 35 y.o. female.   Chief Complaint: wrist pain HPI: 35 yo female with right deQuervains tenosynovitis.  She has tried non operative measures without lasting relief.  She wishes to have right first dorsal compartment release.  Allergies:  Allergies  Allergen Reactions   Metronidazole Nausea And Vomiting   Metformin And Related Other (See Comments)    Per patient, in large doses it causes upset stomach     Past Medical History:  Diagnosis Date   Acanthosis nigricans    Chlamydia    Diabetes mellitus without complication ([place])    Dysmenorrhea    Fibroid    H/O keloid of skin    at abdominal surgical incision   Herpes simplex vulvovaginitis    High-tone pelvic floor dysfunction    HSV-2 infection    Hyperlipidemia    Hyperlipidemia, unspecified    Hypertension    Leiomyoma of uterus, unspecified    Obesity    Other obesity due to excess calories    Palpitation    Pleural effusion    Shortness of breath    when climbing over 3 flts stairs   Tachycardia    Tendonitis    Type II diabetes mellitus ([place])     Past Surgical History:  Procedure Laterality Date   MYOMECTOMY N/A 12/24/2013   Procedure: MYOMECTOMY;  Surgeon: Thurnell Lose, MD;  Location: [facility];  Service: Gynecology;  Laterality: N/A;    Family History: Family History  Problem Relation Age of Onset   Hypertension Mother    Hypertension Maternal Grandmother    Hyperlipidemia Maternal Grandmother    Diabetes Maternal Grandmother     Social History:   reports that she has never smoked. She has never used smokeless tobacco. She reports current alcohol use of about 3.0 standard drinks per week. She reports that she does not use drugs.  Medications: No medications prior to admission.    Results for orders placed or performed during the hospital encounter of 11/06/20 (from the past 48 hour(s))  Basic metabolic panel per protocol     Status: None   Collection Time: 11/04/20 10:13 AM   Result Value Ref Range   Sodium 138 135 - 145 mmol/L   Potassium 4.7 3.5 - 5.1 mmol/L   Chloride 106 98 - 111 mmol/L   CO2 22 22 - 32 mmol/L   Glucose, Bld 99 70 - 99 mg/dL    Comment: Glucose reference range applies only to samples taken after fasting for at least 8 hours.   BUN 11 6 - 20 mg/dL   Creatinine, Ser 0.92 0.44 - 1.00 mg/dL   Calcium 9.8 8.9 - 10.3 mg/dL   GFR, Estimated >60 >60 mL/min    Comment: (NOTE) Calculated using the CKD-EPI Creatinine Equation (2021)    Anion gap 10 5 - 15    Comment: Performed at [facility] [address] [place], [place] [address]    No results found.   A comprehensive review of systems was negative.  Height '5\' 1"'$  (1.549 m), weight 95.2 kg.  General appearance: alert, cooperative, and appears stated age Head: Normocephalic, without obvious abnormality, atraumatic Neck: supple, symmetrical, trachea midline Cardio: regular rate and rhythm Resp: clear to auscultation bilaterally Extremities: Intact sensation and capillary refill all digits.  +epl/fpl/io.  No wounds.  Pulses: 2+ and symmetric Skin: Skin color, texture, turgor normal. No rashes or lesions Neurologic: Grossly normal Incision/Wound: none  Assessment/Plan Right dequervains tenosynovitis.  Non operative and  operative treatment options have been discussed with the patient and patient wishes to proceed with operative treatment. Risks, benefits, and alternatives of surgery have been discussed and the patient agrees with the plan of care.   Rebecca Allison 11/06/2020, 8:38 AM

## 2020-11-06 NOTE — Anesthesia Preprocedure Evaluation (Addendum)
Anesthesia Evaluation  Patient identified by MRN, date of birth, ID band Patient awake    Reviewed: Allergy & Precautions, NPO status , Patient's Chart, lab work & pertinent test results  Airway Mallampati: III  TM Distance: >3 FB Neck ROM: Full    Dental  (+) Teeth Intact, Dental Advisory Given   Pulmonary shortness of breath and with exertion,    Pulmonary exam normal breath sounds clear to auscultation       Cardiovascular hypertension, Pt. on medications Normal cardiovascular exam Rhythm:Regular Rate:Normal  EKG 09/30/20 NSR, Normal   Neuro/Psych  Neuromuscular disease negative psych ROS   GI/Hepatic negative GI ROS, Neg liver ROS,   Endo/Other  diabetes, Well Controlled, Type 2, Oral Hypoglycemic Agents, Insulin DependentMorbid obesityObesity Started on Insulin therapy 3 mos. Ago, HbA1c at that time 10+  Renal/GU negative Renal ROS  negative genitourinary   Musculoskeletal Right first dorsal compartment DeQuervain's tenosynovitis   Abdominal (+) + obese,   Peds  Hematology negative hematology ROS (+)   Anesthesia Other Findings   Reproductive/Obstetrics HSV                            Anesthesia Physical Anesthesia Plan  ASA: 3  Anesthesia Plan: General   Post-op Pain Management:    Induction: Intravenous  PONV Risk Score and Plan: 4 or greater and Propofol infusion, Treatment may vary due to age or medical condition, Ondansetron and Midazolam  Airway Management Planned: LMA  Additional Equipment: None  Intra-op Plan:   Post-operative Plan: Extubation in OR  Informed Consent: I have reviewed the patients History and Physical, chart, labs and discussed the procedure including the risks, benefits and alternatives for the proposed anesthesia with the patient or authorized representative who has indicated his/her understanding and acceptance.     Dental advisory given  Plan  Discussed with: CRNA and Anesthesiologist  Anesthesia Plan Comments:         Anesthesia Quick Evaluation

## 2020-11-06 NOTE — Op Note (Signed)
NAME: Rebecca Allison RECORD NO: [id number] DATE OF BIRTH: February 18, 1986 FACILITY: [facility] LOCATION: [facility] PHYSICIAN: Tennis Must, MD   OPERATIVE REPORT   DATE OF PROCEDURE: 11/06/20    PREOPERATIVE DIAGNOSIS: Right de Quervain's tenosynovitis   POSTOPERATIVE DIAGNOSIS: Right de Quervain's tenosynovitis   PROCEDURE: Right first dorsal compartment release   SURGEON:  [name], M.D.   ASSISTANT: none   ANESTHESIA:  General   INTRAVENOUS FLUIDS:  Per anesthesia flow sheet.   ESTIMATED BLOOD LOSS:  Minimal.   COMPLICATIONS:  None.   SPECIMENS:  none   TOURNIQUET TIME:    Total Tourniquet Time Documented: Upper Arm (Right) - 11 minutes Total: Upper Arm (Right) - 11 minutes    DISPOSITION:  Stable to PACU.   INDICATIONS: 35 year old female with de Quervain's tenosynovitis right wrist.  She is tried nonoperative measures without lasting relief.  She wishes to have right first dorsal compartment release.  Risks, benefits and alternatives of surgery were discussed including the risks of blood loss, infection, damage to nerves, vessels, tendons, ligaments, bone for surgery, need for additional surgery, complications with wound healing, continued pain, stiffness.  She voiced understanding of these risks and elected to proceed.  OPERATIVE COURSE:  After being identified preoperatively by myself,  the patient and I agreed on the procedure and site of the procedure.  The surgical site was marked.  Surgical consent had been signed. She was given IV antibiotics as preoperative antibiotic prophylaxis. She was transferred to the operating room and placed on the operating table in supine position with the Right upper extremity on an arm board.  General anesthesia was induced by the anesthesiologist.  Right upper extremity was prepped and draped in normal sterile orthopedic fashion.  A surgical pause was performed between the surgeons, anesthesia, and operating  room staff and all were in agreement as to the patient, procedure, and site of procedure.  Tourniquet at the proximal aspect of the extremity was inflated to 250 mmHg after exsanguination of the arm with an Esmarch bandage.  Incision was made at the radial side of the wrist.  This is carried into subcutaneous tissues by spreading technique.  Care was taken to protect any nerve branches.  The first dorsal compartment was identified.  It was clear of any soft tissue coverage.  It was released under direct visualization from distal to proximal.  The APL and EPB tendons were identified.  There was not a separate compartment for the EPB tendon.  The fascia was released just proximal and distal to the first dorsal compartment as well.  The wound was then copiously irrigated with sterile saline.  It was closed with 4-0 nylon in a horizontal mattress fashion.  He was injected with quarter percent plain Marcaine to aid in postoperative analgesia.  It was then dressed with sterile Xeroform 4 x 4's and an ABD used as a splint.  This was wrapped with Kerlix and Ace bandage.  The tourniquet was deflated at 11 minutes.  Fingertips were pink with brisk capillary refill after deflation of tourniquet.  The operative  drapes were broken down.  The patient was awoken from anesthesia safely.  She was transferred back to the stretcher and taken to PACU in stable condition.  I will see her back in the office in 1 week for postoperative followup.  I will give her a prescription for Norco 5/325 1-2 tabs PO q6 hours prn pain, dispense #15.   [name], MD Electronically  signed, 11/06/20

## 2020-11-06 NOTE — Anesthesia Postprocedure Evaluation (Signed)
Anesthesia Post Note  Patient: Rebecca Allison  Procedure(s) Performed: RIGHT FIRST DORSAL COMPARTMENT RELEASE (DEQUERVAIN) (Right: Wrist)     Patient location during evaluation: PACU Anesthesia Type: General Level of consciousness: awake and alert and oriented Pain management: pain level controlled Vital Signs Assessment: post-procedure vital signs reviewed and stable Respiratory status: spontaneous breathing, nonlabored ventilation and respiratory function stable Cardiovascular status: blood pressure returned to baseline and stable Postop Assessment: no apparent nausea or vomiting Anesthetic complications: no   No notable events documented.  Last Vitals:  Vitals:   11/06/20 1200 11/06/20 1215  BP: 127/81 (!) 133/92  Pulse: 98 88  Resp: 14 15  Temp:    SpO2: 96% 99%    Last Pain:  Vitals:   11/06/20 1215  TempSrc:   PainSc: 7                  Rebecca Severtson A.

## 2020-11-06 NOTE — Anesthesia Procedure Notes (Signed)
Procedure Name: LMA Insertion Date/Time: 11/06/2020 11:10 AM Performed by: Lavonia Dana, CRNA Pre-anesthesia Checklist: Patient identified, Emergency Drugs available, Suction available and Patient being monitored Patient Re-evaluated:Patient Re-evaluated prior to induction Oxygen Delivery Method: Circle system utilized Preoxygenation: Pre-oxygenation with 100% oxygen Induction Type: IV induction Ventilation: Mask ventilation without difficulty LMA: LMA inserted LMA Size: 4.0 Number of attempts: 1 Airway Equipment and Method: Bite block Placement Confirmation: positive ETCO2 Tube secured with: Tape Dental Injury: Teeth and Oropharynx as per pre-operative assessment

## 2020-11-07 ENCOUNTER — Encounter (HOSPITAL_BASED_OUTPATIENT_CLINIC_OR_DEPARTMENT_OTHER): Payer: Self-pay | Admitting: Orthopedic Surgery

## 2020-12-08 ENCOUNTER — Other Ambulatory Visit: Payer: Self-pay | Admitting: Physician Assistant

## 2020-12-08 DIAGNOSIS — R229 Localized swelling, mass and lump, unspecified: Secondary | ICD-10-CM

## 2020-12-08 DIAGNOSIS — D489 Neoplasm of uncertain behavior, unspecified: Secondary | ICD-10-CM

## 2020-12-09 ENCOUNTER — Ambulatory Visit
Admission: RE | Admit: 2020-12-09 | Discharge: 2020-12-09 | Disposition: A | Discharge status: Home / Self Care | Payer: Managed Care, Other (non HMO) | Source: Ambulatory Visit | Attending: Physician Assistant | Admitting: Physician Assistant

## 2020-12-09 DIAGNOSIS — R229 Localized swelling, mass and lump, unspecified: Secondary | ICD-10-CM

## 2020-12-09 DIAGNOSIS — D489 Neoplasm of uncertain behavior, unspecified: Secondary | ICD-10-CM

## 2021-02-02 DIAGNOSIS — M654 Radial styloid tenosynovitis [de Quervain]: Secondary | ICD-10-CM | POA: Diagnosis not present

## 2021-02-04 DIAGNOSIS — R200 Anesthesia of skin: Secondary | ICD-10-CM | POA: Diagnosis not present

## 2021-02-04 DIAGNOSIS — L720 Epidermal cyst: Secondary | ICD-10-CM | POA: Diagnosis not present

## 2021-03-03 DIAGNOSIS — Z01419 Encounter for gynecological examination (general) (routine) without abnormal findings: Secondary | ICD-10-CM | POA: Diagnosis not present

## 2021-03-10 DIAGNOSIS — M654 Radial styloid tenosynovitis [de Quervain]: Secondary | ICD-10-CM | POA: Diagnosis not present

## 2021-03-10 DIAGNOSIS — E7800 Pure hypercholesterolemia, unspecified: Secondary | ICD-10-CM | POA: Diagnosis not present

## 2021-03-10 DIAGNOSIS — L989 Disorder of the skin and subcutaneous tissue, unspecified: Secondary | ICD-10-CM | POA: Diagnosis not present

## 2021-03-10 DIAGNOSIS — G5631 Lesion of radial nerve, right upper limb: Secondary | ICD-10-CM | POA: Diagnosis not present

## 2021-03-10 DIAGNOSIS — Z0000 Encounter for general adult medical examination without abnormal findings: Secondary | ICD-10-CM | POA: Diagnosis not present

## 2021-03-10 DIAGNOSIS — Z23 Encounter for immunization: Secondary | ICD-10-CM | POA: Diagnosis not present

## 2021-03-10 DIAGNOSIS — E1169 Type 2 diabetes mellitus with other specified complication: Secondary | ICD-10-CM | POA: Diagnosis not present

## 2021-03-11 DIAGNOSIS — E7800 Pure hypercholesterolemia, unspecified: Secondary | ICD-10-CM | POA: Diagnosis not present

## 2021-03-11 DIAGNOSIS — E1169 Type 2 diabetes mellitus with other specified complication: Secondary | ICD-10-CM | POA: Diagnosis not present

## 2021-04-10 DIAGNOSIS — M65831 Other synovitis and tenosynovitis, right forearm: Secondary | ICD-10-CM | POA: Diagnosis not present

## 2021-04-10 DIAGNOSIS — M654 Radial styloid tenosynovitis [de Quervain]: Secondary | ICD-10-CM | POA: Diagnosis not present

## 2021-04-14 DIAGNOSIS — L309 Dermatitis, unspecified: Secondary | ICD-10-CM | POA: Diagnosis not present

## 2021-04-14 DIAGNOSIS — L080 Pyoderma: Secondary | ICD-10-CM | POA: Diagnosis not present

## 2021-04-14 DIAGNOSIS — L720 Epidermal cyst: Secondary | ICD-10-CM | POA: Diagnosis not present

## 2021-04-21 DIAGNOSIS — M654 Radial styloid tenosynovitis [de Quervain]: Secondary | ICD-10-CM | POA: Diagnosis not present

## 2021-04-21 DIAGNOSIS — G5631 Lesion of radial nerve, right upper limb: Secondary | ICD-10-CM | POA: Diagnosis not present

## 2021-06-17 DIAGNOSIS — G5631 Lesion of radial nerve, right upper limb: Secondary | ICD-10-CM | POA: Diagnosis not present

## 2021-06-17 DIAGNOSIS — M654 Radial styloid tenosynovitis [de Quervain]: Secondary | ICD-10-CM | POA: Diagnosis not present

## 2021-06-30 DIAGNOSIS — M25631 Stiffness of right wrist, not elsewhere classified: Secondary | ICD-10-CM | POA: Diagnosis not present

## 2021-06-30 DIAGNOSIS — G5631 Lesion of radial nerve, right upper limb: Secondary | ICD-10-CM | POA: Diagnosis not present

## 2021-06-30 DIAGNOSIS — M654 Radial styloid tenosynovitis [de Quervain]: Secondary | ICD-10-CM | POA: Diagnosis not present

## 2021-06-30 DIAGNOSIS — M79641 Pain in right hand: Secondary | ICD-10-CM | POA: Diagnosis not present

## 2021-07-13 DIAGNOSIS — L720 Epidermal cyst: Secondary | ICD-10-CM | POA: Diagnosis not present

## 2021-08-12 DIAGNOSIS — G5631 Lesion of radial nerve, right upper limb: Secondary | ICD-10-CM | POA: Diagnosis not present

## 2021-08-12 DIAGNOSIS — M654 Radial styloid tenosynovitis [de Quervain]: Secondary | ICD-10-CM | POA: Diagnosis not present

## 2021-08-28 DIAGNOSIS — H0013 Chalazion right eye, unspecified eyelid: Secondary | ICD-10-CM | POA: Diagnosis not present

## 2021-09-08 DIAGNOSIS — R7309 Other abnormal glucose: Secondary | ICD-10-CM | POA: Diagnosis not present

## 2021-09-08 DIAGNOSIS — E1169 Type 2 diabetes mellitus with other specified complication: Secondary | ICD-10-CM | POA: Diagnosis not present

## 2021-09-08 DIAGNOSIS — E7800 Pure hypercholesterolemia, unspecified: Secondary | ICD-10-CM | POA: Diagnosis not present

## 2021-09-14 DIAGNOSIS — M67833 Other specified disorders of tendon, right wrist: Secondary | ICD-10-CM | POA: Diagnosis not present

## 2021-09-14 DIAGNOSIS — M25531 Pain in right wrist: Secondary | ICD-10-CM | POA: Diagnosis not present

## 2021-10-05 DIAGNOSIS — M654 Radial styloid tenosynovitis [de Quervain]: Secondary | ICD-10-CM | POA: Diagnosis not present

## 2021-10-05 DIAGNOSIS — G5631 Lesion of radial nerve, right upper limb: Secondary | ICD-10-CM | POA: Diagnosis not present

## 2021-11-04 DIAGNOSIS — G5631 Lesion of radial nerve, right upper limb: Secondary | ICD-10-CM | POA: Diagnosis not present

## 2021-11-04 DIAGNOSIS — M654 Radial styloid tenosynovitis [de Quervain]: Secondary | ICD-10-CM | POA: Diagnosis not present

## 2021-11-05 DIAGNOSIS — R1084 Generalized abdominal pain: Secondary | ICD-10-CM | POA: Diagnosis not present

## 2021-11-05 DIAGNOSIS — E119 Type 2 diabetes mellitus without complications: Secondary | ICD-10-CM | POA: Diagnosis not present

## 2021-11-05 DIAGNOSIS — I10 Essential (primary) hypertension: Secondary | ICD-10-CM | POA: Diagnosis not present

## 2021-11-05 DIAGNOSIS — Z8639 Personal history of other endocrine, nutritional and metabolic disease: Secondary | ICD-10-CM | POA: Diagnosis not present

## 2021-11-05 DIAGNOSIS — R000 Tachycardia, unspecified: Secondary | ICD-10-CM | POA: Diagnosis not present

## 2021-11-05 DIAGNOSIS — M545 Low back pain, unspecified: Secondary | ICD-10-CM | POA: Diagnosis not present

## 2021-11-05 DIAGNOSIS — S39012A Strain of muscle, fascia and tendon of lower back, initial encounter: Secondary | ICD-10-CM | POA: Diagnosis not present

## 2021-11-05 DIAGNOSIS — Y92410 Unspecified street and highway as the place of occurrence of the external cause: Secondary | ICD-10-CM | POA: Diagnosis not present

## 2021-11-05 DIAGNOSIS — N390 Urinary tract infection, site not specified: Secondary | ICD-10-CM | POA: Diagnosis not present

## 2021-11-05 DIAGNOSIS — R109 Unspecified abdominal pain: Secondary | ICD-10-CM | POA: Diagnosis not present

## 2021-11-13 DIAGNOSIS — M549 Dorsalgia, unspecified: Secondary | ICD-10-CM | POA: Diagnosis not present

## 2021-11-13 DIAGNOSIS — R109 Unspecified abdominal pain: Secondary | ICD-10-CM | POA: Diagnosis not present

## 2021-11-24 DIAGNOSIS — M549 Dorsalgia, unspecified: Secondary | ICD-10-CM | POA: Diagnosis not present

## 2021-11-26 DIAGNOSIS — M549 Dorsalgia, unspecified: Secondary | ICD-10-CM | POA: Diagnosis not present

## 2021-11-27 DIAGNOSIS — D259 Leiomyoma of uterus, unspecified: Secondary | ICD-10-CM | POA: Diagnosis not present

## 2021-11-27 DIAGNOSIS — L729 Follicular cyst of the skin and subcutaneous tissue, unspecified: Secondary | ICD-10-CM | POA: Diagnosis not present

## 2021-12-01 DIAGNOSIS — M549 Dorsalgia, unspecified: Secondary | ICD-10-CM | POA: Diagnosis not present

## 2021-12-02 DIAGNOSIS — M549 Dorsalgia, unspecified: Secondary | ICD-10-CM | POA: Diagnosis not present

## 2021-12-08 DIAGNOSIS — M549 Dorsalgia, unspecified: Secondary | ICD-10-CM | POA: Diagnosis not present

## 2021-12-09 DIAGNOSIS — M549 Dorsalgia, unspecified: Secondary | ICD-10-CM | POA: Diagnosis not present

## 2021-12-15 DIAGNOSIS — M549 Dorsalgia, unspecified: Secondary | ICD-10-CM | POA: Diagnosis not present

## 2021-12-16 DIAGNOSIS — E1165 Type 2 diabetes mellitus with hyperglycemia: Secondary | ICD-10-CM | POA: Diagnosis not present

## 2021-12-16 DIAGNOSIS — E559 Vitamin D deficiency, unspecified: Secondary | ICD-10-CM | POA: Diagnosis not present

## 2021-12-16 DIAGNOSIS — E785 Hyperlipidemia, unspecified: Secondary | ICD-10-CM | POA: Diagnosis not present

## 2021-12-17 DIAGNOSIS — M549 Dorsalgia, unspecified: Secondary | ICD-10-CM | POA: Diagnosis not present

## 2021-12-24 DIAGNOSIS — M549 Dorsalgia, unspecified: Secondary | ICD-10-CM | POA: Diagnosis not present

## 2021-12-31 DIAGNOSIS — E119 Type 2 diabetes mellitus without complications: Secondary | ICD-10-CM | POA: Diagnosis not present

## 2021-12-31 DIAGNOSIS — H5213 Myopia, bilateral: Secondary | ICD-10-CM | POA: Diagnosis not present

## 2022-02-09 DIAGNOSIS — M542 Cervicalgia: Secondary | ICD-10-CM | POA: Diagnosis not present

## 2022-02-09 DIAGNOSIS — M5412 Radiculopathy, cervical region: Secondary | ICD-10-CM | POA: Diagnosis not present

## 2022-02-09 DIAGNOSIS — M792 Neuralgia and neuritis, unspecified: Secondary | ICD-10-CM | POA: Diagnosis not present

## 2022-02-28 DIAGNOSIS — M5412 Radiculopathy, cervical region: Secondary | ICD-10-CM | POA: Diagnosis not present

## 2022-03-04 DIAGNOSIS — D259 Leiomyoma of uterus, unspecified: Secondary | ICD-10-CM | POA: Diagnosis not present

## 2022-03-05 DIAGNOSIS — Z01419 Encounter for gynecological examination (general) (routine) without abnormal findings: Secondary | ICD-10-CM | POA: Diagnosis not present

## 2022-03-08 DIAGNOSIS — M4802 Spinal stenosis, cervical region: Secondary | ICD-10-CM | POA: Diagnosis not present

## 2022-03-08 DIAGNOSIS — M47812 Spondylosis without myelopathy or radiculopathy, cervical region: Secondary | ICD-10-CM | POA: Diagnosis not present

## 2022-03-08 DIAGNOSIS — M792 Neuralgia and neuritis, unspecified: Secondary | ICD-10-CM | POA: Diagnosis not present

## 2022-03-18 DIAGNOSIS — E559 Vitamin D deficiency, unspecified: Secondary | ICD-10-CM | POA: Diagnosis not present

## 2022-03-18 DIAGNOSIS — E7800 Pure hypercholesterolemia, unspecified: Secondary | ICD-10-CM | POA: Diagnosis not present

## 2022-03-18 DIAGNOSIS — E1165 Type 2 diabetes mellitus with hyperglycemia: Secondary | ICD-10-CM | POA: Diagnosis not present

## 2022-04-07 DIAGNOSIS — E1165 Type 2 diabetes mellitus with hyperglycemia: Secondary | ICD-10-CM | POA: Diagnosis not present

## 2022-04-07 DIAGNOSIS — Z1159 Encounter for screening for other viral diseases: Secondary | ICD-10-CM | POA: Diagnosis not present

## 2022-04-07 DIAGNOSIS — Z23 Encounter for immunization: Secondary | ICD-10-CM | POA: Diagnosis not present

## 2022-04-07 DIAGNOSIS — E7800 Pure hypercholesterolemia, unspecified: Secondary | ICD-10-CM | POA: Diagnosis not present

## 2022-04-07 DIAGNOSIS — Z0000 Encounter for general adult medical examination without abnormal findings: Secondary | ICD-10-CM | POA: Diagnosis not present

## 2022-04-07 DIAGNOSIS — E559 Vitamin D deficiency, unspecified: Secondary | ICD-10-CM | POA: Diagnosis not present

## 2022-04-26 DIAGNOSIS — M654 Radial styloid tenosynovitis [de Quervain]: Secondary | ICD-10-CM | POA: Diagnosis not present

## 2022-04-26 DIAGNOSIS — G5631 Lesion of radial nerve, right upper limb: Secondary | ICD-10-CM | POA: Diagnosis not present
